# Patient Record
Sex: Female | Born: 1955 | Race: White | Hispanic: No | Smoking: Never smoker
Health system: Southern US, Community
[De-identification: ages and names within clinical notes are randomized; demographics above are authoritative.]

## PROBLEM LIST (undated history)

## (undated) DIAGNOSIS — E119 Type 2 diabetes mellitus without complications: Secondary | ICD-10-CM

## (undated) DIAGNOSIS — S8990XA Unspecified injury of unspecified lower leg, initial encounter: Secondary | ICD-10-CM

## (undated) DIAGNOSIS — I4891 Unspecified atrial fibrillation: Secondary | ICD-10-CM

## (undated) DIAGNOSIS — E785 Hyperlipidemia, unspecified: Secondary | ICD-10-CM

## (undated) DIAGNOSIS — F32A Depression, unspecified: Secondary | ICD-10-CM

## (undated) DIAGNOSIS — I10 Essential (primary) hypertension: Secondary | ICD-10-CM

## (undated) DIAGNOSIS — I639 Cerebral infarction, unspecified: Secondary | ICD-10-CM

## (undated) HISTORY — DX: Hyperlipidemia, unspecified: E78.5

## (undated) HISTORY — DX: Cerebral infarction, unspecified: I63.9

## (undated) HISTORY — PX: HYSTERECTOMY: SHX81

## (undated) HISTORY — PX: TONSILLECTOMY AND ADENOIDECTOMY: SUR1326

## (undated) HISTORY — DX: Depression, unspecified: F32.A

## (undated) HISTORY — PX: TONSILLECTOMY: SUR1361

## (undated) HISTORY — DX: Essential (primary) hypertension: I10

## (undated) HISTORY — DX: Unspecified atrial fibrillation: I48.91

## (undated) HISTORY — DX: Type 2 diabetes mellitus without complications: E11.9

---

## 2014-02-18 ENCOUNTER — Encounter: Payer: Self-pay | Admitting: Psychiatric/Mental Health

## 2014-02-18 ENCOUNTER — Ambulatory Visit (INDEPENDENT_AMBULATORY_CARE_PROVIDER_SITE_OTHER): Payer: Medicare Other | Admitting: Psychiatric/Mental Health

## 2014-02-18 DIAGNOSIS — F1920 Other psychoactive substance dependence, uncomplicated: Secondary | ICD-10-CM

## 2014-02-18 DIAGNOSIS — F1994 Other psychoactive substance use, unspecified with psychoactive substance-induced mood disorder: Secondary | ICD-10-CM

## 2014-02-18 MED ORDER — BUPROPION HCL ER (XL) 150 MG PO TB24
150.0000 mg | ORAL_TABLET | Freq: Every morning | ORAL | Status: DC
Start: 2014-02-18 — End: 2014-03-19

## 2014-02-19 NOTE — Progress Notes (Signed)
[facility] Initial Evaluation    Date/Time:   02/19/2014  1:48 PM  Name:  Lisa Hampton, Lisa Hampton  MRN:   [id number]  Age:   58 y.o.  DOB:   1955/09/06        Referred by     Self    Chief Complaint   Patient presents with   . Depression   . Anxiety   . Alcohol Problem       History of Present Illness     Source and Reliability: This is the first psychiatric medication evaluation for this 58 year-old female.  The history was obtained from the patient and available records and is considered reliable. Limits of confidentiality were discussed with the patient and he/she participated in the interview voluntarily.      CC- "I've been meaning to see a Psychiatrist for awhile now."    HPI: States a history of depression and anxiety that has been managed by her PCM for the last 3 years.  This was a result of feelings of "sadness" post menopause.  Has had several recent stressors to include her mom having alzheimers and is worried she will get it as well.  Has 2 daughters and one recently left her husband and child which bothers her.  Moved out of mothers home in [place] to live with daughter and help raise her 3 kids which has been difficult.  Has had some recent family deaths.  On disability for A-fib and "felt like my life was over.  What am I going to do now?"  States intermittent uncontrolled crying, anhedonia and anergia.  "I feel lonely." Currently on paxil 20mg  though it was recently increased to 30mg  and then to 40 but couldn't tolerate so went back down to 20mg .  Also on Ativan 0.5mg  for the last 3 years and is taking approximately 2 times a week.  Has also been on Zoloft previously.  LLGD thought process with no evidence of internal stimuli or delusions.  No sx of mania.  Appetite good and not malnourished.  Denies feelings of hopeless/helpless and denies HI/SI.      Psychiatric Review of Symptoms:  All other symptoms reviewed and are negative except for this outlined above.    Psychiatric History:   Denies any  in or outpatient treatment previously.  No thoughts or behaviors of self harm.  Managed on meds by her PCM as above.    Psychiatric Family History:  States 2 sisters, both with Bipolar Disorder.    Substance History:   Endorses having 5-6 drinks a day which she feels is a way of coping with her depression.  The patient endorsed blackouts and forgetting certain things when drinking such as endings of movies or conversations she had with people on specific topics.  States daughter and son in law have become more concerned which is one of the reasons she is seeking assistance.  No legal/occupational problems with alcohol.  CAGE - 3/4.  No tobacco use, and denies illicit substance use.       Psychosocial History:   Born in [place] and then moved around for a little bit as father was in the [employer].  [place] 10 moved [place] which she considers home and went to highschool.  Went to nursing school, got married and moved to [place].  Worked as a Engineer, civil (consulting), mostly in L&D and stopped in 2010 after getting a-fib and going on disability.  Divorced after 24 years.  Has 2 daughters both grown with their own  children and families.  Currently living with daughter as above.       Psychiatric Review of Systems     Subjective Mood sad   Sleep no onset or maintenance problems   Appetite/Weight Normal / No known change   Concentration Normal   Energy Level Decreased   Psychosis Hallucinations - None  Delusions - None   Suicidal Suicidal Thoughts? no   Suicidal Plan?  no   Safety Access to Guns? NO access  Safety: Denies SI, HI, commits to safety   Homicidal Ideations no   Self-Injury Ideations no       Psychiatric/Mental Status Examination     General Appearance Neatly groomed, appropriately dressed and adequately nourished   Level of consciousness A&O x 4, Sensorium Clear   Attention & Concentration  Normal   Mini-MSE  Not indicated this visit       Cooperative/Engaged? Cooperative & Engaged   Psychomotor   Unremarkable   Speech Normal  Rate, Rythym, & Volume   Mood Dysphoric   Affect Full-Range / Expressive   Eye Contact Appropriate       Memory Intact   Thought Process & Associations Logical, Linear, Goal Directed   Thought Content  Delusions: None  Suicidal or Homicidal Ideation: No  Hallucinations: None      Insight & Judgment fair       Other Findings:        Physical Examination     There were no vitals filed for this visit.    No LMP recorded. Patient has had a hysterectomy.      Neuromuscular  No Tics, Tremors, Gait Impairments   Integumentary No Lesions, Injuries, Rashes on Exposed Skin   Respiratory No SOB, full expansion/excursion, normal rate     Past Psychiatric History     Current MH Provider(s) Katie Faraone   Current/Previous Diagnoses See HPI   Previous Medications See HPI   Psych Hospitalizations No   Suicide Attempts? No    Self Injury? No   Violence to Others No   History of Trauma/Abuse No history   Head Injury No   Seizures No       Family Psychiatric History      Mental Illness  Yes: See HPI   Substance Abuse No   Suicides No       Social History:     Lives With Child(ren)   Marital/Children divorced / 2 Child(ren)   Social Supports Friend(s)   Employment Status Disabled/Public Assistance   Legal issues No     Substance Use History     Recreational Drugs Types: Denies any  Pattern: Has never used   Alcohol Heavy, 5-6 drinks per day   Sub Abuse Treatments? None      Medical History     Past Medical History   Diagnosis Date   . Atrial fibrillation    . Diabetes mellitus    . Hypertension    . Hyperlipidemia    . CVA (cerebral vascular accident)        Past Surgical History   Procedure Laterality Date   . Tonsillectomy     . Hysterectomy         No Known Allergies       Assessment     58 y.o. female with alcohol dependence and co-morbid depression.  Due to active drinking will dx as substance induced.  Already on Paxil so discussed using Wellbutrin to assist with anergia and anhedonia.  No history of  a seizure disorder or head  trauma.  Patient was instructed that meds would only be able to be continued in the future if she began to work on her recovery.  She was given the information to contact [facility] for a CATS assessment.    Diagnosis(es):  Axis I: Substance induced mood disorder/depression.  Substance dependence  Axis II: Deferred  Axis III: a-fib, diabetes, HTN, Hyperlipidemia  Axis IV: Occupational, family stressors  Axis V: 55-60        Treatment Plan     Treatment options and alternatives reviewed with patient, along with detailed discussion of medication(s) and side effects, and they concur with following plan:    1. Meds - Side effects discussed to include cost/benefit to which patient consented.  1. Wellbutrin Xl 150mg  daily  2. Continue Paxil 20mg  daily  2. Follow up - The patient to follow up with this provider in 1 month, first available, or sooner as necessary.  3. I strongly recommended not drinking alcohol, at all, while taking medications.  I have reviewed the inherent risks, to include risk of death, by combining alcohol or drugs with medications (especially benzodiazepines and sedating medications). The patient understands that repeated use of alcohol while taking medications knowingly adds significant risk of increased health and functional problems.  The patient should consult with his medical provider before starting any new supplements or medications. Never mix alcohol with medications.   4. Discard all old medications - Patient was instructed that s/he should discard all medications not currently prescribed or considered active.  5. Emergency room, or call 911.   6. Medications reconciled  7. Consults - Recommended therapy and patient given a list of providers in local area.  Recommended patient for CATS.  8. Liaison - None  9. Labs - None  10. Thank you.    Carola Frost, MSN, PMHNP-BC    _____________________________________________  Carola Frost, NP    [facility]

## 2014-03-17 ENCOUNTER — Ambulatory Visit (INDEPENDENT_AMBULATORY_CARE_PROVIDER_SITE_OTHER): Payer: Medicare Other | Admitting: Clinical

## 2014-03-17 DIAGNOSIS — F1994 Other psychoactive substance use, unspecified with psychoactive substance-induced mood disorder: Secondary | ICD-10-CM

## 2014-03-17 DIAGNOSIS — F419 Anxiety disorder, unspecified: Secondary | ICD-10-CM

## 2014-03-17 NOTE — Progress Notes (Signed)
Initial Clinical Interview    Name:  Lisa Hampton Nov 19, 1955 Medical Records: [id number]      Time in: 2:00  PM         Time out:  2:55  PM    Signs and Symptoms:   Client's/Caregiver's statement of problems and impairments (e.g., social, academic, affective, cognitive, memory, physical):    Depressive Disorder 311.00: usually depressed, loss of interest/pleasure, sleep +/- and fatigue.     Pt is a 58 yo female referred here by her primary care doctor for depression. Pt has a long history of alcohol use. As recently as one month ago, pt was drinking 5-6 drinks per day, but has since cut back what she says is 50%. She lives in [place] with one of her daughters, daughters husband, and their 3 children. She has another daughter who struggles with addiction and with whom pt is not speaking to presently. Pts mother is in an [facility], and pts goal is to move back to [facility] within 2 years. Pt is not interested in CATS at this time, but was given information about the program. We will continue to assess whether or not that would be a more appropriate level of care. Pt reports that her biggest issue is depression, but that she is working on this through medication and through doing things she enjoys such as walking, and spending time with her 3 grandchildren. Pt will be back in 2 weeks.     Past History:   Social:    Marital status:  divorced.    Children: 2.    Friendships: Has friends    Mental History:  Patient denied .    Previous inpatient stay?  no.    Previous outpatient visit?   no.    Physical Health:   Significant:  Pt is on disability due to a chronic illness.    Substance abuse: Significant:  Pt has been drinking regularly since college, 5-6 drinks per day until one month ago..    Legal History:  Patient denied     Current Outpatient Prescriptions on File Prior to Visit   Medication Sig Dispense Refill   . aspirin 81 MG chewable tablet Chew 81 mg by mouth daily.     Marland Kitchen buPROPion XL (WELLBUTRIN XL) 150 MG 24 hr tablet  Take 1 tablet (150 mg total) by mouth every morning. 30 tablet 0   . Cholecalciferol (VITAMIN D) 1000 UNIT tablet Take 1,000 Units by mouth daily.     Marland Kitchen diltiazem (CARDIZEM CD) 180 MG 24 hr capsule Take 180 mg by mouth daily.     Marland Kitchen lisinopril (PRINIVIL,ZESTRIL) 20 MG tablet Take 20 mg by mouth daily.     Marland Kitchen LORazepam (ATIVAN) 0.5 MG tablet Take 0.5 mg by mouth every 6 (six) hours as needed for Anxiety.     . metFORMIN (GLUCOPHAGE) 500 MG tablet Take 250 mg by mouth 2 (two) times daily with meals.     . Omega-3 Fatty Acids (OMEGA-3 FISH OIL) 500 MG Cap Take by mouth.     Marland Kitchen PARoxetine (PAXIL) 20 MG tablet Take 20 mg by mouth every morning.     . rosuvastatin (CRESTOR) 40 MG tablet Take 50 mg by mouth daily.     Marland Kitchen warfarin (COUMADIN) 3 MG tablet Take 3 mg by mouth daily.       No current facility-administered medications on file prior to visit.        Mental Status Exam:   1).  Clinical  Observations:    Appearance:   age appropriate and casually dressed.     Attitude toward examiner:   Appropriate.       2).  Stream of Consciousness:    Normal.      3).  Thought Content:    Normal.    4).  Affect/Mood:    Normal.      DSM Diagnoses:     Axis I:   Alcohol dependence  303.90 and Depressive disorder NOS  311.00  Axis II:  Defer  Axis III: See problem list in the medical record  Axis IV: Economic problems  Other psychosocial or environmental problems  Problems related to social environment  Axis V: 61-70: mild symptoms.        PLAN:       Cognitive-Behavioral Therapy strategies.   Interpersonal, solution focused and support therapy strategies.

## 2014-03-19 ENCOUNTER — Encounter: Payer: Self-pay | Admitting: Psychiatric/Mental Health

## 2014-03-19 ENCOUNTER — Ambulatory Visit (INDEPENDENT_AMBULATORY_CARE_PROVIDER_SITE_OTHER): Payer: Medicare Other | Admitting: Psychiatric/Mental Health

## 2014-03-19 DIAGNOSIS — F419 Anxiety disorder, unspecified: Secondary | ICD-10-CM

## 2014-03-19 DIAGNOSIS — F1994 Other psychoactive substance use, unspecified with psychoactive substance-induced mood disorder: Secondary | ICD-10-CM

## 2014-03-19 MED ORDER — BUPROPION HCL ER (XL) 150 MG PO TB24
150.0000 mg | ORAL_TABLET | Freq: Every morning | ORAL | Status: DC
Start: 2014-03-19 — End: 2014-06-18

## 2014-03-19 MED ORDER — PAROXETINE HCL 20 MG PO TABS
20.0000 mg | ORAL_TABLET | Freq: Every morning | ORAL | Status: DC
Start: 2014-03-19 — End: 2014-07-13

## 2014-03-25 NOTE — Progress Notes (Signed)
Name:  Lisa Hampton  July 25, 1955  Medical Record Number:  [id number]    Date:  03/25/2014      Interim History - 58 year old female with history of depression being seen for a follow up.  Currently on Wellbuttrin XL 150mg  daily and Paxil 20mg  daily.  Also getting PRN ativan from PCP that she takes sporadically though not everyday.  Called and spoke to [facility] about CATS but states she isn't able to do the 3 hours, 3 days every week but has cut back on alcohol intake.  "No alcohol except for wine.  No liquor or beer.  Several days I haven't had any."  Has also recently started seeing  A therapist.  States things are pretty good.  "I feel better on the Wellbutrin as it give me more energy and I don't feel as depressed."  Reduced paxil on her own to 20mg .  States her daughter has also noticed a signficant improvement in patients mood as well.  No signs of internal stimuli and denies any perceptual disturbances and presents with a LLGD thought process.  No sx of mania or substance disorder.  Denies any feelings of hopelessness/helplessness and denies HI/SI.  Sleeping well and appetite is good and does not appear malnourished.      Psychiatric Specialty Examination   (1-5 bullets- Problem Focused; at least 6 bullets Expanded Problem Focused; at least 9 bullets - Detailed; all bullets- Comprehensive Exam)   . [] Vital Signs see RN assessment that I have reviewed.   General Appearance and Manner:      [x] age appropriate    [] bearded    [x] casually dressed       [] deviant     [x] cooperative [] disheveled    [] older than stated age    [] overweight    [] piercings    [] tattooed    [] thin & gaunt looking    [] well dressed    [] younger than stated age     [x] good eye contact    [] avoidant eye contact    [] hesitant  .    Musculoskeletal: [x] normal    [] rigidity  [] flaccid       [] akathesia    [] choreaoathetoid movt [] tics    Gait: [x] normal gait    [] gait abnormality_______       . Speech:  [x] Normal pitch     [x] normal volume     [] articulation error    [] delayed    [] increased latency of response     [] loud    [] pressured    [] profane     [] soft [] perseveration   . Thought processes:  [x] Normal    []  goal directed   [] logical    []  illogical    [] flight of ideas     [] goal directed    [] concrete    [x]  associations intact [x]  abstract reasoning intact   . Description of associations []  loose   [] circumstantial     [] concrete    [] tangential    [x]  intact    . Description of abnormal or psychotic thoughts []  hallucination   []  delusions     safety:  [x]  absent of suicidal or homicidal ideation [] suicidal ideation      [] suicidal plan      [] suicidal intent      [] passive suicidal ideation      [] homicidal ideation      [] homicidal plan      [] homicidal intent []  actively trying to hurt self []  agitation []  preoccupation with violence   .  judgment  and insight [x]  intact    []  limited       []  fair    [] significantly lacking  []  description_______       . Orientation [x] fully oriented      [] disoriented to    [] time []  Person [] Place   . Memory : [x]  grossly intact    []  immediate recall deficit  []  recent memory deficit  [] delayed memory deficit   [] MMSE_______    [] MOCA________     Attention/Concentration: [x] normal    []  distractible      [] inattentive  .    Marland Kitchen Language: [x] age appropriate    []  naming okay   []  repetition    . Fund of knowledge: [x] age appropriate    []  adequate   [] in adequate []  above average   . Mood and affect: [x]  fine     [] angry    [] anxious    [] constricted    [] depressed     [] euphoric    [] euthymic    [] irritable    [] sad   Other Findings          Risk Assessment:  Suicidal ideation reported/observed:  no Plan:   Homicidal ideation reported/observed:no Plan:    Self-harm behavioral reported/observed: no   Plan:      Other risk behavior(s):    Interventions/Comments:      Studies ordered:    Current Medications:    Current Outpatient Prescriptions   Medication Sig Dispense Refill   . aspirin 81 MG chewable tablet  Chew 81 mg by mouth daily.     Marland Kitchen buPROPion XL (WELLBUTRIN XL) 150 MG 24 hr tablet Take 1 tablet (150 mg total) by mouth every morning. 30 tablet 2   . Cholecalciferol (VITAMIN D) 1000 UNIT tablet Take 1,000 Units by mouth daily.     Marland Kitchen diltiazem (CARDIZEM CD) 180 MG 24 hr capsule Take 180 mg by mouth daily.     Marland Kitchen lisinopril (PRINIVIL,ZESTRIL) 20 MG tablet Take 20 mg by mouth daily.     Marland Kitchen LORazepam (ATIVAN) 0.5 MG tablet Take 0.5 mg by mouth every 6 (six) hours as needed for Anxiety.     . metFORMIN (GLUCOPHAGE) 500 MG tablet Take 250 mg by mouth 2 (two) times daily with meals.     . Omega-3 Fatty Acids (OMEGA-3 FISH OIL) 500 MG Cap Take by mouth.     Marland Kitchen PARoxetine (PAXIL) 20 MG tablet Take 1 tablet (20 mg total) by mouth every morning. 30 tablet 2   . rosuvastatin (CRESTOR) 40 MG tablet Take 50 mg by mouth daily.     Marland Kitchen warfarin (COUMADIN) 3 MG tablet Take 3 mg by mouth daily.       No current facility-administered medications for this visit.         Side Effects:          [x]  Patient describes no side effects and none are in evidence.         []  Patient reported side effects of medication:none    REVIEW OF SYSTEMS  All other systems reviewed and are negative except     NO NEW MEDICAL ISSUES    Diagnosis:  Axis I:   Substance induced mood disorder (depression).  Polysub dep  Axis II:  Defer  Axis III:  See problem list in the medical record  Axis IV: Economic problems  Housing problems  Occupational problems  Other psychosocial or environmental problems  Axis V:  Current:  51-60: moderate  symptoms.               Highest in Past year:  51-60: moderate symptoms.      Treatment Goals:  Maintain function and prevent recurrence    Progress Compared to Expected: has slightly improved.    Treatment Plan: :Continue meds with no changes.  Patient encouraged to attempt to cut back no alcohol intake further.  Encouraged to remain in therapy.    Referral to: Continue therapy    Labs/Diagnostic tests ordered: No orders of the  defined types were placed in this encounter.       Return to office in 3 months.    Salam has been informed of her individual treatment plan.  This includes an explanation of the action of all prescribed psychoactive medications.    The benefits, potential side effects, risks and possible drug interactions were explained to Ursina who indicated that she understood and agrees to the plan of care.  All of her questions were answered.    Informed consent obtained from the patient for the use of Wellbutrin and Paxil after a discussion of the likely benefits, risks and alternatives. Medication side-effects both common and serious were reviewed.     Alfreda indicated that she understood and agrees to the plan of care.  All of her questions were answered to her satisfaction.      Carola Frost PMHNP-BC

## 2014-03-30 ENCOUNTER — Ambulatory Visit (INDEPENDENT_AMBULATORY_CARE_PROVIDER_SITE_OTHER): Payer: Medicare Other | Admitting: Clinical

## 2014-04-02 ENCOUNTER — Encounter (HOSPITAL_BASED_OUTPATIENT_CLINIC_OR_DEPARTMENT_OTHER): Payer: Self-pay

## 2014-04-02 NOTE — Progress Notes (Signed)
Date/Time: 04/02/2014, 1:51 PM  Interviewer: Keane Police  Patient Name: Lisa Hampton   DOB: 13-Jun-1955  SSN: [id number]  Gender: female  Patient Address:  [address] [place] [address]  Patient Phone Number(s):  [phone] (home)  , additional phone number: [phone]  Emergency Contact: , Phone number:   Relationship to Pt:    May we contact this person regarding your admission?     Caller: self  , Caller phone:above, Caller relationship:  Reason for Call: SA tx  Referral Source: Pt's Nurse Practitioner  Notes:    Insurance Info:   Company: [employer] number: [phone]  Policy Holder [facility]):   self   Relationship to PT: self    PH DOB:   Member ID: [id number]  Group #:      Insured Employer:   Insurance Address: [address]      Clinical Information  Presenting Problem: Etoh  Precipitating Event: NP recommended I call Cats to schedule a Cats assessment.  Pt was given an IOP appointment with no prescreen.  Writer followed up with the pt, completed prescreen and explained options regarding Cats tx, including detox which is the level of tx pt would most likely require given her sx profile.  Writer also explained the criteria set forth by Medicare for detox approval.  Pt reported she would consider her options and call back to schedule something if Cats was an option.  -[facility] 04/02/2014    Suicidal/Homicidal Risk?    Any hx of Suicidal thoughts or plans? No    Any hx of Homicidal Thoughts or Plans?  No    Imminent Risk of Suicide and/or Harm to Others?  No         Drug Use   Any current use of alcohol or drugs? alcohol    Do you use tobacco?: No    Drug of Choice # 1: etoh  Pattern of use: daily 6+ drinks for years  Last use? 04/01/2014    Drug of Choice # 2 :   Pattern of use:   Last use?     Drug of Choice # 3 :   Pattern of use:   Last use?:     Additional drug use info:     Current Withdrawal Symptoms:  Cravings, tremors    History of and last date of:    Hallucinations? No           Seizures?  No    Psych/Mental health Issues:   Depression, Anxiety      Medical issues:   Atrial Fibrulation, Diabetes, HTN, Hyperlipedemia,     Current Medication (medical or psychiatric):   Ativan, paxil, metformin, warfarin, crestor, glucophage, lisinopril    Additional Notes:     Does the patient require Hard of Hearing Services? No    Does the patient require language services? No    Preliminary Diagnosis: Alcohol Use Disorder, Severe    Recommended Level of Care: Detox/ Day Tx    Appointment with: Admissions Nurse      Location: [facility]             Date:              Time:     * Current IOP counselors that do assessments:   Garr, Laroy Apple, Ruben Reason    * DTX appointments 8am or 10am Mon-Fri ONLY (Do not include "step down" appointments in the scheduling process)

## 2014-04-06 ENCOUNTER — Encounter (HOSPITAL_BASED_OUTPATIENT_CLINIC_OR_DEPARTMENT_OTHER): Payer: Medicare Other | Admitting: Professional

## 2014-04-06 ENCOUNTER — Ambulatory Visit (INDEPENDENT_AMBULATORY_CARE_PROVIDER_SITE_OTHER): Payer: Medicare Other | Admitting: Clinical

## 2014-04-13 ENCOUNTER — Ambulatory Visit (INDEPENDENT_AMBULATORY_CARE_PROVIDER_SITE_OTHER): Payer: Medicare Other | Admitting: Clinical

## 2014-04-20 ENCOUNTER — Ambulatory Visit (INDEPENDENT_AMBULATORY_CARE_PROVIDER_SITE_OTHER): Payer: Medicare Other | Admitting: Clinical

## 2014-04-27 ENCOUNTER — Ambulatory Visit (INDEPENDENT_AMBULATORY_CARE_PROVIDER_SITE_OTHER): Payer: Medicare Other | Admitting: Clinical

## 2014-05-04 ENCOUNTER — Ambulatory Visit (INDEPENDENT_AMBULATORY_CARE_PROVIDER_SITE_OTHER): Payer: Medicare Other | Admitting: Clinical

## 2014-05-11 ENCOUNTER — Ambulatory Visit (INDEPENDENT_AMBULATORY_CARE_PROVIDER_SITE_OTHER): Payer: Medicare Other | Admitting: Clinical

## 2014-05-18 ENCOUNTER — Ambulatory Visit (INDEPENDENT_AMBULATORY_CARE_PROVIDER_SITE_OTHER): Payer: Medicare Other | Admitting: Clinical

## 2014-05-25 ENCOUNTER — Inpatient Hospital Stay
Admission: EM | Admit: 2014-05-25 | Discharge: 2014-05-27 | DRG: 605 | Disposition: A | Discharge status: Home / Self Care | Payer: Medicare Other | Attending: Trauma Surgery | Admitting: Trauma Surgery

## 2014-05-25 ENCOUNTER — Other Ambulatory Visit: Payer: Self-pay

## 2014-05-25 ENCOUNTER — Emergency Department: Payer: Medicare Other

## 2014-05-25 ENCOUNTER — Inpatient Hospital Stay: Payer: Medicare Other | Admitting: Trauma Surgery

## 2014-05-25 DIAGNOSIS — S8010XA Contusion of unspecified lower leg, initial encounter: Secondary | ICD-10-CM | POA: Diagnosis present

## 2014-05-25 DIAGNOSIS — S7010XA Contusion of unspecified thigh, initial encounter: Principal | ICD-10-CM | POA: Diagnosis present

## 2014-05-25 DIAGNOSIS — I739 Peripheral vascular disease, unspecified: Secondary | ICD-10-CM | POA: Diagnosis present

## 2014-05-25 DIAGNOSIS — E119 Type 2 diabetes mellitus without complications: Secondary | ICD-10-CM | POA: Diagnosis present

## 2014-05-25 DIAGNOSIS — E785 Hyperlipidemia, unspecified: Secondary | ICD-10-CM | POA: Diagnosis present

## 2014-05-25 DIAGNOSIS — S8012XA Contusion of left lower leg, initial encounter: Secondary | ICD-10-CM

## 2014-05-25 DIAGNOSIS — Z7901 Long term (current) use of anticoagulants: Secondary | ICD-10-CM

## 2014-05-25 DIAGNOSIS — F329 Major depressive disorder, single episode, unspecified: Secondary | ICD-10-CM | POA: Diagnosis present

## 2014-05-25 DIAGNOSIS — W109XXA Fall (on) (from) unspecified stairs and steps, initial encounter: Secondary | ICD-10-CM | POA: Diagnosis present

## 2014-05-25 DIAGNOSIS — I482 Chronic atrial fibrillation, unspecified: Secondary | ICD-10-CM | POA: Diagnosis present

## 2014-05-25 DIAGNOSIS — Z8673 Personal history of transient ischemic attack (TIA), and cerebral infarction without residual deficits: Secondary | ICD-10-CM

## 2014-05-25 DIAGNOSIS — G8911 Acute pain due to trauma: Secondary | ICD-10-CM | POA: Diagnosis present

## 2014-05-25 DIAGNOSIS — I10 Essential (primary) hypertension: Secondary | ICD-10-CM | POA: Diagnosis present

## 2014-05-25 HISTORY — DX: Unspecified atrial fibrillation: I48.91

## 2014-05-25 HISTORY — DX: Hyperlipidemia, unspecified: E78.5

## 2014-05-25 HISTORY — DX: Unspecified injury of unspecified lower leg, initial encounter: S89.90XA

## 2014-05-25 LAB — TYPE AND SCREEN
AB Screen Gel: NEGATIVE
ABO Rh: O POS

## 2014-05-25 LAB — PT/INR
PT INR: 2 — ABNORMAL HIGH (ref 0.9–1.1)
PT: 22.2 s — ABNORMAL HIGH (ref 12.6–15.0)

## 2014-05-25 LAB — CBC
Hematocrit: 36.2 % — ABNORMAL LOW (ref 37.0–47.0)
Hgb: 12 g/dL (ref 12.0–16.0)
MCH: 31.7 pg (ref 28.0–32.0)
MCHC: 33.1 g/dL (ref 32.0–36.0)
MCV: 95.8 fL (ref 80.0–100.0)
MPV: 10.5 fL (ref 9.4–12.3)
Nucleated RBC: 0 /100 WBC (ref 0–1)
Platelets: 187 10*3/uL (ref 140–400)
RBC: 3.78 10*6/uL — ABNORMAL LOW (ref 4.20–5.40)
RDW: 14 % (ref 12–15)
WBC: 7.13 10*3/uL (ref 3.50–10.80)

## 2014-05-25 LAB — PREPARE FRESH FROZEN PLASMA
Expiration Date: 201601080630
Expiration Date: 201601080630
Status: TRANSFUSED
Status: TRANSFUSED
UTYPE: O POS
UTYPE: O POS

## 2014-05-25 LAB — I-STAT CREATININE: Creatinine I-Stat: 0.7 mg/dL (ref 0.6–1.5)

## 2014-05-25 LAB — PT AND APTT
PT INR: 3 — ABNORMAL HIGH (ref 0.9–1.1)
PT: 30.5 s — ABNORMAL HIGH (ref 12.6–15.0)
PTT: 35 s (ref 23–37)

## 2014-05-25 LAB — GFR: EGFR: >60

## 2014-05-25 LAB — BASIC METABOLIC PANEL
BUN: 14 mg/dL (ref 7.0–19.0)
CO2: 19 mEq/L — ABNORMAL LOW (ref 22–29)
Calcium: 8.5 mg/dL (ref 8.5–10.5)
Chloride: 108 mEq/L (ref 100–111)
Creatinine: 0.7 mg/dL (ref 0.6–1.0)
Glucose: 123 mg/dL — ABNORMAL HIGH (ref 70–100)
Potassium: 4.4 mEq/L (ref 3.5–5.1)
Sodium: 137 mEq/L (ref 136–145)

## 2014-05-25 LAB — GLUCOSE WHOLE BLOOD - POCT: Whole Blood Glucose POCT: 149 mg/dL — ABNORMAL HIGH (ref 70–100)

## 2014-05-25 MED ORDER — PAROXETINE HCL 20 MG PO TABS
20.0000 mg | ORAL_TABLET | Freq: Every morning | ORAL | Status: DC
Start: 2014-05-25 — End: 2014-05-27
  Administered 2014-05-25 – 2014-05-26 (×2): 20 mg via ORAL
  Filled 2014-05-25 (×2): qty 1

## 2014-05-25 MED ORDER — OXYCODONE HCL 5 MG PO TABS
10.0000 mg | ORAL_TABLET | ORAL | Status: DC | PRN
Start: 2014-05-25 — End: 2014-05-26
  Administered 2014-05-25 – 2014-05-26 (×2): 10 mg via ORAL
  Filled 2014-05-25 (×2): qty 2

## 2014-05-25 MED ORDER — GLUCAGON 1 MG IJ SOLR (WRAP)
1.0000 mg | INTRAMUSCULAR | Status: DC | PRN
Start: 2014-05-25 — End: 2014-05-27

## 2014-05-25 MED ORDER — LACTATED RINGERS IV SOLN
INTRAVENOUS | Status: DC
Start: 2014-05-25 — End: 2014-05-25

## 2014-05-25 MED ORDER — ACETAMINOPHEN 325 MG PO TABS
650.0000 mg | ORAL_TABLET | Freq: Four times a day (QID) | ORAL | Status: DC | PRN
Start: 2014-05-25 — End: 2014-05-25

## 2014-05-25 MED ORDER — DEXTROSE 50 % IV SOLN
25.0000 mL | INTRAVENOUS | Status: DC | PRN
Start: 2014-05-25 — End: 2014-05-27

## 2014-05-25 MED ORDER — OXYCODONE HCL 5 MG PO TABS
5.0000 mg | ORAL_TABLET | ORAL | Status: DC | PRN
Start: 2014-05-25 — End: 2014-05-27
  Administered 2014-05-25 – 2014-05-27 (×7): 5 mg via ORAL
  Filled 2014-05-25 (×7): qty 1

## 2014-05-25 MED ORDER — LISINOPRIL 20 MG PO TABS
20.0000 mg | ORAL_TABLET | Freq: Every day | ORAL | Status: DC
Start: 2014-05-25 — End: 2014-05-27
  Administered 2014-05-25 – 2014-05-26 (×2): 20 mg via ORAL
  Filled 2014-05-25 (×2): qty 1

## 2014-05-25 MED ORDER — SODIUM CHLORIDE 0.9 % IV SOLN
INTRAVENOUS | Status: AC | PRN
Start: 2014-05-25 — End: 2014-05-25
  Administered 2014-05-25: 1000 mL/h via INTRAVENOUS

## 2014-05-25 MED ORDER — ACETAMINOPHEN 325 MG PO TABS
650.0000 mg | ORAL_TABLET | Freq: Four times a day (QID) | ORAL | Status: DC
Start: 2014-05-25 — End: 2014-05-27
  Administered 2014-05-25 – 2014-05-27 (×7): 650 mg via ORAL
  Filled 2014-05-25 (×7): qty 2

## 2014-05-25 MED ORDER — MORPHINE SULFATE 2 MG/ML IJ/IV SOLN (WRAP)
2.0000 mg | Status: DC | PRN
Start: 2014-05-25 — End: 2014-05-26
  Administered 2014-05-25 (×3): 2 mg via INTRAVENOUS
  Filled 2014-05-25 (×4): qty 1

## 2014-05-25 MED ORDER — IODIXANOL 320 MG/ML IV SOLN
100.0000 mL | Freq: Once | INTRAVENOUS | Status: AC | PRN
Start: 2014-05-25 — End: 2014-05-25
  Administered 2014-05-25: 100 mL via INTRAVENOUS

## 2014-05-25 MED ORDER — ONDANSETRON HCL 4 MG/2ML IJ SOLN
4.0000 mg | Freq: Three times a day (TID) | INTRAMUSCULAR | Status: DC | PRN
Start: 2014-05-25 — End: 2014-05-27
  Administered 2014-05-25 – 2014-05-27 (×2): 4 mg via INTRAVENOUS
  Filled 2014-05-25 (×2): qty 2

## 2014-05-25 MED ORDER — BUPROPION HCL ER (XL) 150 MG PO TB24
150.0000 mg | ORAL_TABLET | Freq: Every day | ORAL | Status: DC
Start: 2014-05-26 — End: 2014-05-27
  Administered 2014-05-26 – 2014-05-27 (×2): 150 mg via ORAL
  Filled 2014-05-25 (×2): qty 1

## 2014-05-25 MED ORDER — INSULIN REGULAR HUMAN 100 UNIT/ML IJ SOLN
1.0000 [IU] | INTRAMUSCULAR | Status: DC | PRN
Start: 2014-05-25 — End: 2014-05-27

## 2014-05-25 NOTE — ED Notes (Signed)
Istat Creat 0.07, result given to [facility].

## 2014-05-25 NOTE — H&P (Signed)
TRAUMA HISTORY AND PHYSICAL     Date Time: 05/25/2014 11:36 AM  Patient Name: [id number],89 A  Attending Physician: Particia Lather, MD  Primary Care Physician: No primary care provider on file.    Date of Admission:   05/25/2014 11:27 AM    Trauma Level:   Yellow    Assessment/Plan:     Patient Active Problem List   Diagnosis   . Leg hematoma   . Warfarin anticoagulation   . Acute pain due to trauma   . Peripheral vascular disease   . Chronic atrial fibrillation       Plan by systems:  Neuro: pain control with po meds  Pulm: encourage IS, OOB  CV: resume home lisinopril, amlodipine; hold coumadin  Endo: accuchecks and SSI  GI: diabetic diet  Heme/ID: no abx indicated, FFP to reverse INR and hold coumadin  Renal: monitor UOP  Neuromuscular: q2h neurovascular checks; watch for expanding hematoma  Psych: no acute issues  Wounds: no acute issues  Pt seen and examined by attdg, Dr Estil Daft, and plan d/w ED attdg, Dr Toney Rakes      Patient will be admitted to: [facility]    Consulting Services:   None    Patient Complaint:   435 A Whiskey is a 59 y.o. female who presents to the hospital after Fall: Fall from # of stairs: 4.  Fell last night around 9pm-- lost balance when carrying several bags down the stairs but was able to get up and walk to bed. No LOC. This morning when she awoke the LLE was noted to be painful and swollen. EMS reported no distal pulses. Currently she c/o LLE pain, denies paresthesia.  Motor intact. No pain anywhere else.    Scene Report:      Scene GCS: Eye opening 4 - spontaneous, Verbal Response 5 - alert/oriented, Motor Response 6 - obeys commands. Total GCS: 15   Transport: BLS, Time of Injury 9pm last night   Transferred from: From Scene   LOC: No   Intubated: No   Hemodynamically: Stable   C-spine immobilized pre-hospital: No        The medications, past medical/surgical history, family history, allergies & full review of systems were:  Reviewed    Allergies:   No Known Allergies    Medication:    Coumadin  Lisinopril  Amlodipine  Metformin    Past Medical History:     Past Medical History   Diagnosis Date   . A-fib    . CVA (cerebral vascular accident)    . Hypertension    . Hyperlipemia    . Diabetes mellitus    . Knee joint injury      torn ligaments       Past Surgical History:     Past Surgical History   Procedure Laterality Date   . Tonsillectomy and adenoidectomy     . Hysterectomy         Family History:   History reviewed. No pertinent family history.    Social History:     History     Social History   . Marital Status: N/A     Spouse Name: N/A     Number of Children: N/A   . Years of Education: N/A     Social History Main Topics   . Smoking status: Never Smoker    . Smokeless tobacco: Not on file   . Alcohol Use: No   . Drug Use: No   . Sexual Activity:  Not on file     Other Topics Concern   . Not on file     Social History Narrative   . No narrative on file   no tobacco, social EtOH, no IVDU    Vaccination:   Tetanus up to date: Unknown    Review of Systems:   Review of Systems   Constitutional: Negative for fever and chills.   HENT: Negative for nosebleeds.    Eyes: Negative for blurred vision and double vision.   Respiratory: Negative for cough and shortness of breath.    Cardiovascular: Negative for chest pain and palpitations.   Gastrointestinal: Negative for nausea, vomiting and abdominal pain.   Genitourinary: Negative for hematuria and flank pain.   Musculoskeletal: Positive for myalgias (LLE at knee), joint pain (L knee) and falls. Negative for back pain and neck pain.   Skin: Negative for rash.   Neurological: Negative for dizziness, tingling, sensory change, focal weakness, loss of consciousness, weakness and headaches.   Endo/Heme/Allergies: Bruises/bleeds easily (on coumadin).   Psychiatric/Behavioral: Negative.        Physical Exam:   Physical Exam   Constitutional: She is oriented to person, place, and time and well-developed, well-nourished, and in no distress. No distress.   HENT:    Head: Normocephalic and atraumatic.   Right Ear: External ear normal.   Left Ear: External ear normal.   Nose: No nasal deformity or nasal septal hematoma. No epistaxis.   Mouth/Throat: Oropharynx is clear and moist.   Eyes: Conjunctivae, EOM and lids are normal. Pupils are equal, round, and reactive to light.   Pupils equal and reactive 4mm b/l   Neck: No spinous process tenderness present.   No cervical collar in place   Cardiovascular: Normal rate and normal heart sounds.  An irregularly irregular rhythm present.   Pulses:       Radial pulses are 2+ on the right side, and 2+ on the left side.   Pulmonary/Chest: Effort normal and breath sounds normal. She exhibits no tenderness and no deformity.   Abdominal: Soft. She exhibits no distension. There is no tenderness.   Pelvis stable   Musculoskeletal: Normal range of motion.        Left knee: She exhibits swelling and ecchymosis. She exhibits normal range of motion and no deformity. Tenderness found.        Cervical back: She exhibits no bony tenderness and no deformity.        Thoracic back: She exhibits no bony tenderness and no deformity.        Lumbar back: She exhibits no bony tenderness and no deformity.   No gross deformities to all 4 extremities, motor and sensation intact to all 4 extremities  LLE with ecchymosis, edema at knee and just below knee medially  Palpable femoral pulses b/l, L DP and PT nonpalpable but with strong doppler signals, R DP palpable, R PT nonpalpable with with strong doppler signal, b/l feet cool, b/l lower legs with dry peeling skin   Neurological: She is alert and oriented to person, place, and time. GCS score is 15.   Skin: Skin is warm and dry. She is not diaphoretic.   Vitals reviewed.      Filed Vitals:    05/25/14 1204   BP: 163/102   Pulse: 116   Temp:    Resp: 18   SpO2: 100%         Labs:     Results     Procedure Component  Value Units Date/Time    Basic Metabolic Panel (BMP) [[id number]]  (Abnormal) Collected:  05/25/14  1147    Specimen Information:  Blood Updated:  05/25/14 1232     Glucose 123 (H) mg/dL      BUN 40.9 mg/dL      Creatinine 0.7 mg/dL      CALCIUM 8.5 mg/dL      Sodium 811 mEq/L      Potassium 4.4 mEq/L      Chloride 108 mEq/L      CO2 19 (L) mEq/L     GFR [[id number]] Collected:  05/25/14 1147     EGFR >60.0 Updated:  05/25/14 1232    PT/APTT [[id number]]  (Abnormal) Collected:  05/25/14 1147     PT 30.5 (H) sec Updated:  05/25/14 1228     PT INR 3.0 (H)      PT Anticoag. Given Within 48 hrs. warfarin (Couma      PTT 35 sec     CBC WITHOUT Differential [[id number]]  (Abnormal) Collected:  05/25/14 1147    Specimen Information:  Blood / Blood Updated:  05/25/14 1217     WBC 7.13 x10 3/uL      Hgb 12.0 g/dL      Hematocrit 41.3 (L) %      Platelets 187 x10 3/uL      RBC 3.78 (L) x10 6/uL      MCV 95.8 fL      MCH 31.7 pg      MCHC 33.1 g/dL      RDW 14 %      MPV 10.5 fL      Nucleated RBC 0 /100 WBC     i-Stat Creatinine [[id number]] Collected:  05/25/14 1208     i-STAT Creatinine 0.7 mg/dL Updated:  53/66/44 0347          Rads:   Radiological Procedure reviewed.      FOCUSED ABDOMINAL SONOGRAM  XR TIBIA FIBULA LEFT AP AND LATERAL  CT ANGIOGRAM LOWER EXTREMITY BILATERAL    Radiology Results (24 Hour)     Procedure Component Value Units Date/Time    CT Angiogram Lower Extremity Bilateral [[id number]] Collected:  05/25/14 1257    Order Status:  Completed Updated:  05/25/14 1355    Narrative:      CLINICAL HISTORY: Trauma to the left lower leg. Status post fall from  stairs. Left lower extremity pain and swelling.    TECHNIQUE: CTA of bilateral lower extremities was performed following  uneventful intravenous administration of 100 cc Visipaque 320 contrast.  MIP and 3-D reconstructions obtained.    FINDINGS: A large subcutaneous hematoma is seen anteriorly from the  distal left thigh into the prepatellar space/bursa. There are some fluid  hematocrit levels within this hematoma. A small arterial contrast blush  is seen along  the posterior superior margin of the hematoma suggesting  site of active bleed. This measures up to 3.3 cm in AP thickness and  extends approximately 18 cm craniocaudad length on sagittal  reconstructions.    Atherosclerotic calcification of the included distal abdominal aorta  with no aneurysm. Atherosclerotic calcifications and plaque of the iliac  arteries with no significant stenosis. The common, external and internal  iliac arteries are patent bilaterally.    Left lower extremity: Diffuse atherosclerotic disease of the lower  extremity arterial system. The common femoral, superficial femoral,  popliteal arteries are patent with no significant stenosis. Profunda  femoral artery is patent. The tibial peroneal trunk, anterior tibial,  posterior tibial and peroneal arteries are patent into the ankle and  foot.    Right lower extremity: Diffuse atherosclerotic disease of the lower  extremity arterial system. The common femoral, superficial femoral and  popliteal arteries are patent with no significant stenosis. Profunda  femoral artery is patent. The tibial peroneal trunk, anterior tibial,  posterior tibial and peroneal arteries are patent into the ankle and  foot.    No bowel dilatation in the included lower abdomen and pelvis. Normal  appendix. Colonic diverticulosis. No free fluid. No adenopathy. Bladder  appears unremarkable. Degenerative changes in the lower lumbar spine. No  joint effusion seen at the knees.      Impression:        1. Large subcutaneous hematoma anteriorly in the left distal thigh  extending into the prepatellar space/bursa. Small contrast blush along  the anterior superior margin of the hematoma compatible with site of  active bleed.  2. Diffuse atherosclerotic disease but no significant stenosis or  occlusion of the lower extremity arterial system. Three-vessel runoff  bilaterally.  3. Colonic diverticulosis.    COMMENT: This urgent result was discussed with and acknowledged by Dr.  Betsey Holiday,  at 1300 hours on 05/25/2014.    Kennyth Lose, MD   05/25/2014 1:18 PM      Tibia Fibula Left AP and Lateral [[id number]] Collected:  05/25/14 1214    Order Status:  Completed Updated:  05/25/14 1219    Narrative:      Indications: Trauma    AP and lateral views of the left lower leg were performed. This  demonstrated marked soft tissue swelling anterior to the patella. There  is no evidence of acute bony injury. The ankle mortise is anatomic.      Impression:       Soft tissue swelling anterior to the patella. No evidence of  acute bony injury.    Remer Macho, MD   05/25/2014 12:15 PM      Focused Abdominal Sonogram (FAST) [[id number]] Resulted:  05/25/14 1129    Order Status:  Completed Updated:  05/25/14 1129    Narrative:      The attached image(s) (individually and collectively, "Image") is/are from   emergency department visit encounter(s) occurring outside the radiology   department at [facility]. This examination is performed by clinical staff. The   image(s) is/are not reviewed by radiology personal and there is no   radiology interpretation provided. For a report for this procedure please   refer to the trauma physician notes in Epic.           Attending Attestation:   I have seen the patient, duplicated the exam and reviewed the flow sheet, labs and imaging studies.  I agree with the assessment and plan.    Large left thigh hematoma with area of active blush. No evidence of acute ischemia although she has chronic peripheral vascular disease with only the right DP palpable and the remaining pedal pulses only with doppler signals. No evidence of compartment syndrome, pain is not out of proportion to physical findings, she has intact sensations and motor function in the foot. INR is supertherapeutic, coumadin on hold will reverse with FFP. Monitor vascular exam closely in [facility].    Particia Lather, MD, [facility]  Acute Care Surgeon  (General/Trauma/Critical Care)  [phone]

## 2014-05-25 NOTE — Progress Notes (Addendum)
Trauma / Acute Care Surgery NP    Subjective:  S/p fall down 4 stairs on 05/24/14 @ 2100. Patient continues with pain/tenderness to the LLE, sensation and motor intact.       Objective:  Filed Vitals:    05/25/14 1800   BP: 158/94   Pulse: 101   Temp: 98.1 F (36.7 C)   Resp: 24   SpO2: 99%       Physical exam:  A & O x 3, pain poorly controlled with IV morphine  Hemodynamically stable, a fib   Room Air  LLE hematoma at knee medially, up medial thigh and lateral below the knee , PT/DP pulses present with doppler    Plan:  2 ffp given in ED   repeat INR 2.0 (3.0)  Mark hematoma with sharpie   Wrap LLE with ace  If hematoma expands, give additional ffp  Tylenol scheduled and prn oxy 5-10  Restart home PM medication: Paxil and lisinopril, hold coumadin

## 2014-05-25 NOTE — Plan of Care (Signed)
Pt admitted to [facility] after fall 1/3 around 9pm. Large hematoma on left leg- marked with sharpie by MD. Monitor for increased bleeding, loss of pulses/sensation. R6E4. Pain reported at 8/10- 10mg  PRN oxycodone given with adequate effect. Pulses palpable aside from LLE DP and PT which are found via doppler. Q2H pulse checks. Afebrile. A fib on monitor, sinus tach. BP 150's/90s. Up to bedside commode with x1 assist- voided 1x yellow, clear urine. PT 22.2 and INR 2.0- remain off coumadin. No labs ordered for AM.

## 2014-05-25 NOTE — ED Provider Notes (Signed)
Physician/Midlevel provider first contact with patient: 05/25/14 1150                                      [facility] H&P         CLINICAL SUMMARY          Diagnosis:    .     Final diagnoses:   Leg hematoma, left, initial encounter         MDM Notes:      Admission note:  Pt reevaluated prior to admission. Patient understands and consents to hospital admission. Admitting physician contacted and accepts to the admitting floor. All results reviewed with patient and admitting physician.         Disposition:         Admit              CLINICAL INFORMATION        PRIMARY SURVEY:      Airway:  airway patent  Breathing: breath sounds equal bilaterally  Circulation: Dopplerable DP and PT only in left foot, not palpable. Dopplerable PT on right, palpable DP.  Otherwise normal throughout.  Disability: GCS: 15      HPI:      Chief Complaint: Fall: Fall from # of stairs: 3-4     Lisa Hampton is a 59 y.o. female who  has a past medical history of A-fib; CVA (cerebral vascular accident); Hypertension; Hyperlipemia; Diabetes mellitus; and Knee joint injury., BIBA presents with LLE pain and swelling.  Pt fell down ~3-4 stairs at 9pm yesterday, landing on L knee and outstretched R hand.  No LOC, did not strike her head.  Some pain to knee at that time but was able to stand and ambulate to bed.  When she woke up this AM, she noted severe pain and swelling to LLE.  Unable to bear weight or move leg without pain.  EMS were unable to get palpable pulses on leg.  Given Fentanyl en route with significant improvement of pain.     Symptoms are severe in severity.  Trauma occurred: <12 hours PTA    History obtained from: patient, EMS      ROS:      Positive and negative ROS elements as per HPI.  All other systems reviewed and negative.      Physical Exam:      Pulse 94  BP (!) 152/92 mmHg  Resp 18  SpO2 96 %  Temp 97.5 F (36.4 C)    Constitutional: A&Ox3. Vital sings reviewed.   Head: normocephalic and  atraumatic. Bilateral TMs and external ears normal, no hemotympanum. Nose normal. No sinus tenderness, nasal deformity or nasal septal hematoma. Oropharynx clear and moist. No facial trauma, no facial bone tenderness.   Eyes: PERRL, 4mm.  Neck:  No tracheal deviation. No c-spine stepoffs or TTP. No crepitus.  Cardiovascular: Irregularly, irregular.   Pulmonary/Chest: Breath sounds normal. No respiratory distress. No chest wall tenderness, no laceration, no crepitus.   Abdominal: Soft, nondistended, nontender. No rebound, no guarding.   GU: Normal rectal tone, no blood in rectum. No blood at the meatus.   Extremities: Pelvis stable. No wounds, no contusions, no lacerations, no deformities. Very swollen L calf with ecchymosis medially. Large echhymosis and swelling to left knee and left upper upper leg.  Abrasion to L knee.Dopplerable DP and PT RLE but not palpable. Dopplerable PT  on right, palpable DP.   Thoracic and lumbar spine: No tenderness or stepoffs along T or L spine.   Neurological: GCS 15. Normal strength and sensation throughout.              PAST HISTORY        Primary Care Provider: No primary care provider on file.        PMH/PSH:    .     Past Medical History   Diagnosis Date   . A-fib    . CVA (cerebral vascular accident)    . Hypertension    . Hyperlipemia    . Diabetes mellitus    . Knee joint injury      torn ligaments       She has past surgical history that includes Tonsillectomy and adenoidectomy and Hysterectomy.      Social/Family History:      She reports that she has never smoked. She does not have any smokeless tobacco history on file. She reports that she does not drink alcohol or use illicit drugs.    History reviewed. No pertinent family history.      Listed Medications on Arrival:    .     Previous Medications    WARFARIN (COUMADIN) 1 MG TABLET    Take 1 mg by mouth.      Allergies: She has No Known Allergies.            VISIT INFORMATION        Clinical Course in the ED:      The  patient was brough by EMS directly to the trauma room and was evaluated with the trauma team per protocol.      Medications Given in the ED:    .     ED Medication Orders     Start     Status Ordering Provider    05/25/14 1150  0.9%  NaCl infusion   Code/trauma/sedation continuous med     Route: Intravenous     Last MAR action:  New Bag SEOUDI, HANI            Procedures:      Procedures      Interpretations:      Critical Care Time (not including procedures): 30-74 minutes.   Due to the high risk of critical illness or multi-organ failure at initial presentation and/or during ED course.    System(s) at risk for compromise:  metabolic  Critical Diagnosis:   1. Leg hematoma, left, initial encounter         The patient was Hypotensive:   No     The patient was Hypoxic:   No     This does not including time spent performing other reported procedures or services.   Critical care time involved full attention to the patient's condition and included:   Review of nursing notes and/or old charts - Yes  Documentation time - Yes  Care, transfer of care, and discharge plans - Yes  Obtaining necessary history from family, EMS, nursing home staff and/or treating physicians - Yes  Review of medications, allergies, and vital signs - Yes   Consultant collaboration on findings and treatment options - Yes  Ordering, interpreting, and reviewing diagnostic studies/tab tests - Yes                 RESULTS        Lab Results:      Results     Procedure Component  Value Units Date/Time    Basic Metabolic Panel (BMP) [[id number]]  (Abnormal) Collected:  05/25/14 1147    Specimen Information:  Blood Updated:  05/25/14 1232     Glucose 123 (H) mg/dL      BUN 40.9 mg/dL      Creatinine 0.7 mg/dL      CALCIUM 8.5 mg/dL      Sodium 811 mEq/L      Potassium 4.4 mEq/L      Chloride 108 mEq/L      CO2 19 (L) mEq/L     GFR [[id number]] Collected:  05/25/14 1147     EGFR >60.0 Updated:  05/25/14 1232    PT/APTT [[id number]]  (Abnormal) Collected:   05/25/14 1147     PT 30.5 (H) sec Updated:  05/25/14 1228     PT INR 3.0 (H)      PT Anticoag. Given Within 48 hrs. warfarin (Couma      PTT 35 sec     CBC WITHOUT Differential [[id number]]  (Abnormal) Collected:  05/25/14 1147    Specimen Information:  Blood / Blood Updated:  05/25/14 1217     WBC 7.13 x10 3/uL      Hgb 12.0 g/dL      Hematocrit 41.3 (L) %      Platelets 187 x10 3/uL      RBC 3.78 (L) x10 6/uL      MCV 95.8 fL      MCH 31.7 pg      MCHC 33.1 g/dL      RDW 14 %      MPV 10.5 fL      Nucleated RBC 0 /100 WBC     i-Stat Creatinine [[id number]] Collected:  05/25/14 1208     i-STAT Creatinine 0.7 mg/dL Updated:  53/66/44 0347              Radiology Results:      CT Angiogram Lower Extremity Bilateral   Preliminary Result      Tibia Fibula Left AP and Lateral   Final Result    Soft tissue swelling anterior to the patella. No evidence of   acute bony injury.      Remer Macho, MD    05/25/2014 12:15 PM         Focused Abdominal Sonogram (FAST)   Preliminary Result                  Scribe Attestation:      Attestations:  I was acting as a scribe for Orlinda Blalock, MD on [id number] Naaman Plummer, HELEN    I am the first provider for this patient and I personally performed the services documented. MILLER, HELEN is scribing for me on [id number] A. This note accurately reflects work and decisions made by me.  Orlinda Blalock, MD                                  Orlinda Blalock, MD  05/25/14 [phone]

## 2014-05-25 NOTE — Progress Notes (Addendum)
05/25/14 Pt is a 59 year old female Lisa Hampton, DOB 04/20/2056) who presents in the ED as a code yellow after slipping and falling down 3-4 steps.  Immediately after the fall, pt states she was ambulatory and later went to bed.  This morning, pt noted LLE pain with swelling and the inability to ambulate.  Medics arrived to find pt's left leg without distal pulses.  Pt has a h/o CVA, DM and Afib.  Pt's daughter [name], [phone]) is currently at work but was notified of her mother's hospitalization by pt's friend who was on scene.  Pt resides with her daughter at:  [address] [place], [place] [address].  As per pt, she is divorced and on disability for Afib.   She has other children but they are not local.  Pt does not believe that her daughter will be leaving work to come to the hospital.  Pt will keep daughter updated by phone.      [name], MSW, ACSW  ED Social Worker  [facility]  [address]  [place], [place] [address]  [phone] or [phone]

## 2014-05-25 NOTE — ED Notes (Signed)
Next PT/INR will be at 5:30pm

## 2014-05-25 NOTE — Progress Notes (Signed)
Patient examined; there is no increase to LLE hematoma.   Continue to monitor.    Griselda Miner. Charissa Knowles NP   x [phone]

## 2014-05-25 NOTE — Progress Notes (Signed)
On-call Note  05/25/2014  11:31 PM    Patient seen and examined.  LLE hematoma actually appears slightly receded from borders drawn on by team earlier in the day.  Leg is soft, though sites with ecchymosis are fairly tender to palpation.  Pain control is improved.    RN reported brief desaturation to 87% earlier - no associated tachycardia or tachypnea, patient asymptomatic.  She does report a history of possible COPD (per patient, diagnosed the last time she had bronchitis).  She was at a O2 saturation of 98-99% on 1L when I visited.    -- Continue compression dressing for now  -- Continue to monitor LLE hematoma - appears improved  -- Pain control with scheduled Tylenol, PRN oxycodone, PRN morphine for breakthrough  -- O2 decreased to 0.5 L/min - continue to wean as able.  Patient observed for a few minutes, O2 saturation 93-94%    Unknown Jim, MD  General Surgery Resident, PGY-2  Team Spectra: [phone]  Pager#: [phone]

## 2014-05-26 DIAGNOSIS — S8012XA Contusion of left lower leg, initial encounter: Secondary | ICD-10-CM | POA: Insufficient documentation

## 2014-05-26 LAB — CBC AND DIFFERENTIAL
Basophils Absolute Automated: 0.01 10*3/uL (ref 0.00–0.20)
Basophils Automated: 0 %
Eosinophils Absolute Automated: 0.12 10*3/uL (ref 0.00–0.70)
Eosinophils Automated: 2 %
Hematocrit: 29.2 % — ABNORMAL LOW (ref 37.0–47.0)
Hgb: 9.2 g/dL — ABNORMAL LOW (ref 12.0–16.0)
Immature Granulocytes Absolute: 0 10*3/uL
Immature Granulocytes: 0 %
Lymphocytes Absolute Automated: 2.02 10*3/uL (ref 0.50–4.40)
Lymphocytes Automated: 41 %
MCH: 30.9 pg (ref 28.0–32.0)
MCHC: 31.5 g/dL — ABNORMAL LOW (ref 32.0–36.0)
MCV: 98 fL (ref 80.0–100.0)
MPV: 10.2 fL (ref 9.4–12.3)
Monocytes Absolute Automated: 0.66 10*3/uL (ref 0.00–1.20)
Monocytes: 13 %
Neutrophils Absolute: 2.11 10*3/uL (ref 1.80–8.10)
Neutrophils: 43 %
Nucleated RBC: 0 /100 WBC (ref 0–1)
Platelets: 141 10*3/uL (ref 140–400)
RBC: 2.98 10*6/uL — ABNORMAL LOW (ref 4.20–5.40)
RDW: 14 % (ref 12–15)
WBC: 4.92 10*3/uL (ref 3.50–10.80)

## 2014-05-26 LAB — GLUCOSE WHOLE BLOOD - POCT
Whole Blood Glucose POCT: 151 mg/dL — ABNORMAL HIGH (ref 70–100)
Whole Blood Glucose POCT: 164 mg/dL — ABNORMAL HIGH (ref 70–100)
Whole Blood Glucose POCT: 129 mg/dL — ABNORMAL HIGH (ref 70–100)
Whole Blood Glucose POCT: 165 mg/dL — ABNORMAL HIGH (ref 70–100)

## 2014-05-26 LAB — PT/INR
PT INR: 1.8 — ABNORMAL HIGH (ref 0.9–1.1)
PT: 20.7 s — ABNORMAL HIGH (ref 12.6–15.0)

## 2014-05-26 MED ORDER — SENNOSIDES-DOCUSATE SODIUM 8.6-50 MG PO TABS
2.0000 | ORAL_TABLET | Freq: Two times a day (BID) | ORAL | Status: DC
Start: 2014-05-26 — End: 2014-05-27
  Administered 2014-05-26 – 2014-05-27 (×3): 2 via ORAL
  Filled 2014-05-26 (×3): qty 2

## 2014-05-26 MED ORDER — LISINOPRIL 20 MG PO TABS
20.0000 mg | ORAL_TABLET | Freq: Every day | ORAL | Status: DC
Start: 2014-05-26 — End: 2014-05-26

## 2014-05-26 MED ORDER — ROSUVASTATIN CALCIUM 20 MG PO TABS
40.0000 mg | ORAL_TABLET | Freq: Every evening | ORAL | Status: DC
Start: 2014-05-26 — End: 2014-05-27
  Administered 2014-05-26: 40 mg via ORAL
  Filled 2014-05-26 (×2): qty 2

## 2014-05-26 MED ORDER — DILTIAZEM HCL ER COATED BEADS 180 MG PO CP24
180.0000 mg | ORAL_CAPSULE | Freq: Every day | ORAL | Status: DC
Start: 2014-05-26 — End: 2014-05-27
  Administered 2014-05-26 – 2014-05-27 (×2): 180 mg via ORAL
  Filled 2014-05-26 (×2): qty 1

## 2014-05-26 NOTE — Progress Notes - Trauma (Signed)
TSN met with pt to offer psychosocial support after trauma. Mental health assessment completed, and TSN contact information provided.     Family: Pt states she stays with her daughter and was here helping her for a few years when this happened. She states she is not worried about having support through recovery.  Employment: Pt is on disability from when she had a stroke and does not work.   Mental Health hx and current status: Pt says she was recently put on Wellbutrin to manage anxiety. She says she had a "situation" with her other daughter that caused her to seek some mental health support, and she continues to see her psychiatrist. Pt states her symptoms are currently well managed and she is not having anxiety while in the hospital.   Acute Stress Disorder/Post-Traumatic Stress: Pt denies and does not present with current symptoms of acute stress or PTSD.   Coping/Adjustment: Pt appears to be coping appropriately and shows no signs of distress r/t trauma or recovery.   Referrals: none  Overall Assessment: Pt states she sometimes loses her footing on steps, and she was holding items when she miscalculated the number of stairs, causing her to fall. She presents as upbeat and optimistic about her recovery, and pt seems motivated to participate in therapy and progress through recovery. No questions or concerns at this time.   Predicted Future Needs: Pt should be asked about anxiety management to ensure she has appropriate support through trauma recovery.    Virgel Gess, MA, [place] [id number]

## 2014-05-26 NOTE — PT Eval Note (Signed)
[facility]   Physical Therapy Evaluation   Patient: Lisa Hampton    MRN#: [id number]   Unit: [facility] TOWER 3  Bed: F341/F341.01    Discharge Recommendations:   Discharge Recommendation: Home with PRN assist as needed  DME Recommendation: DME Recommended for Discharge: Front wheel walker (PT issued crutches for stairs)    Left with SCDs           : on R LE only  Left with bed alarm   : not in use RN aware  Left with chair alarm : " " "  Left with fall mats       : in place    Assessment:   Lisa Hampton is a 59 y.o. female admitted 05/25/2014.  Pt presents with slightly reduced mobility from baseline level of function and would benefit from PT services to optimize independence with mobility and safety prior to discharging to the next level of care as recommended above.            Impairments: Assessment: Decreased functional mobility;Gait impairment.     Therapy Diagnosis: mobility impairment    Rehabilitation Potential: Prognosis: Good    Treatment Activities: Evaluation of mobility, strength and ROM.  Educated the patient to role of physical therapy, plan of care, goals of therapy and Lisa Hampton recommendations and safety with mobility.    Education: FWW / crutch training    Plan:   Treatment/Interventions: Gait training;Equipment eval/education;Compensatory technique education     PT Frequency: follow-up visit only   Risks/Benefits/POC Discussed with Pt/Family: With patient        Precautions and Contraindications:   Weight Bearing Status:  (L LE WBAT)    Consult received for Lisa Hampton for PT Evaluation and Treatment.  Patient's medical condition is appropriate for Physical therapy intervention at this time.    Medical Diagnosis: Leg hematoma, left, initial encounter [S80.12XA]      History of Present Illness:   Lisa Hampton is a 59 y.o. female admitted on 05/25/2014 with Fall from # of stairs: 4.  Fell last night around 9pm-- lost balance when carrying several bags  down the stairs but was able to get up and walk to bed. No LOC. This morning when she awoke the LLE was noted to be painful and swollen. EMS reported no distal pulses. Currently she c/o LLE pain, denies paresthesia.  Motor intact. No pain anywhere else.      Past Medical/Surgical History:  .   Tonsillectomy and adenoidectomy         .   Hysterectomy   .   A-fib      .   CVA (cerebral vascular accident)      .   Hypertension      .   Hyperlipemia      .   Diabetes mellitus      .   Knee joint injury            torn ligaments       X-Rays/Tests/Labs:  IMPRESSION:       1. Large subcutaneous hematoma anteriorly in the left distal thigh  extending into the prepatellar space/bursa. Small contrast blush along  the anterior superior margin of the hematoma compatible with site of  active bleed.  2. Diffuse atherosclerotic disease but no significant stenosis or  occlusion of the lower extremity arterial system. Three-vessel runoff  bilaterally.  3. Colonic diverticulosis.  Social History:   Prior Level of Function:  Prior level of function: Independent with ADLs, Ambulates independently  Driving: independent  DME Currently at Home:  (none)    Home Living Arrangements:  Living Arrangements: Children  Type of Home: House  Home Layout:  (lives in basement, 1 flight stairs)  DME Currently at Home:  (none)  Home Living - Notes / Comments:  (will have assist on [place] as needed)    Subjective:   Patient is agreeable to participation in the therapy session. Nursing clears patient for therapy.     "I'd like to get up"    Patient Goal:  (home)    Pain Assessment  Pain Assessment: Numeric Scale (0-10)  Pain Score: 4-moderate pain  POSS Score: Awake and Alert  Pain Location:  (L thigh on weight bearing)  Pain Intervention(s): Medication (See eMAR);Repositioned  Multiple Pain Sites: No    Objective:   Observation of Patient/Vital Signs:  Patient is in bed.     Observation of Patient/Vital signs:  Inspection/Posture:  (TIMC monitors,  ace wrap L thigh, SCD R LE only)    Cognition  Arousal/Alertness: Appropriate responses to stimuli  Orientation Level: Oriented X4  Following Commands: Follows all commands and directions without difficulty  Safety Awareness: independent  Insights: Fully aware of deficits    Neuro Status  Hand Dominance: right handed  Safety Awareness: intact    Musculoskeletal Examination:  Gross ROM  Neck/Trunk/ extremities ROM: within functional limits    Gross Strength  Neck/Trunk/ extremities Strength: WFL  Except R hip flexors/ quads inhibited by pain : grossly 3-/5    Tone  Tone: within functional limits    Functional Mobility:  Supine to Sit: Modified Independent  Scooting to HOB: Independent  Sit to Supine: Stand by Assist (uses inverted CR as leg lifter)  Sit to Stand: Supervision (at [employer])  Stand to Sit: Retail banker Transfers: Supervision  Device Used for Functional Transfer: front-wheeled walker    Ambulation:  Ambulation: Supervision;with front-wheeled walker (Vs CGA with CRs)  Ambulation Distance (Feet): 120 Feet  Pattern: L decreased stance time;decreased cadence;decreased step length;Step through (with VCs)  Stair Management: Stand by Assist;one rail R;step to pattern;forward;with crutches  Number of Stairs: 4     Balance:  Sitting - Static: Good  Sitting - Dynamic: Good  Standing - Static: Good (with FWW fair with CRs)  Standing - Dynamic: Good (with FWW fair with CRs)    Participation and Activity Tolerance:  Participation Effort: good  Endurance: Tolerates 30+ min exercise without fatigue      Patient left with call bell within reach, all needs met and all questions answered, RN notified of session outcome and patient response.      Goals:   Goals  Goal Formulation: With patient  Time for Goal Acheivement: 1 visit  Pt Will Transfer Bed/Chair: with rolling walker;modified independent;to maximize functional mobility and independence  Pt Will Ambulate: > 200 feet;with rolling walker;modified  independent;to maximize functional mobility and independence  Pt Will Go Up / Down Stairs: 1 flight;modified independent;With rail;With crutches;to maximize functional mobility and independence         Time of treatment:   PT Received On: 05/26/14  Start Time: 1800  Stop Time: 1840  Time Calculation (min): 40 min      Harlan Stains, PT pager [phone] [facility] link [phone]

## 2014-05-26 NOTE — Plan of Care (Addendum)
0800: No acute events. LLE pulses stable and obtainable via doppler. Left thigh ace wrap removed by Misty Stanley NP this morning - site appears stable if not improved. INR this morning = 1.8.     1020: MD team has rounded on Lisa Hampton. Transfer orders written. CBC ordered STAT.     1800: PT has seen patient. No acute events. Possible discharge tomorrow?

## 2014-05-26 NOTE — Progress Notes (Signed)
TRAUMA TERTIARY SURVEY FORM AND INITIAL PROGRESS NOTE       Interval History:   Lisa Hampton is a 59 y.o. female who was admitted 05/25/2014 11:27 AM to [facility] by Particia Lather, MD after Fall: Fall from # of stairs: 4.    Substance usage:   social drinker  The patient denies current or previous tobacco use.  denied.    SBRT (Screening, Brief Intervention, and Referral to Treatment) performed: Yes  CATS (Community addiction team) requested: N/A    Allergies:   No Known Allergies    Medical history:     Past Medical History   Diagnosis Date   . A-fib    . CVA (cerebral vascular accident)    . Hypertension    . Hyperlipemia    . Diabetes mellitus    . Knee joint injury      torn ligaments       Medications:     Prior to Admission medications    Medication Sig Start Date End Date Taking? Authorizing Provider   Ascorbic Acid (VITAMIN C) 1000 MG tablet Take 1,000 mg by mouth daily.   Yes [provider]   aspirin 81 MG chewable tablet Chew 81 mg by mouth daily.   Yes [provider]   lisinopril (PRINIVIL,ZESTRIL) 20 MG tablet Take 20 mg by mouth daily.   Yes [provider]   metFORMIN (GLUCOPHAGE-XR) 500 MG 24 hr tablet Take 500 mg by mouth every morning with breakfast.   Yes [provider]   buPROPion XL (WELLBUTRIN XL) 150 MG 24 hr tablet Take 150 mg by mouth daily.   Yes [provider]   Cholecalciferol (VITAMIN D) 1000 UNIT tablet Take 1,000 Units by mouth daily.   Yes [provider]   diltiazem (DILACOR XR) 180 MG 24 hr capsule Take 180 mg by mouth daily.   Yes [provider]   PARoxetine (PAXIL) 20 MG tablet Take 20 mg by mouth every morning.   Yes [provider]   rosuvastatin (CRESTOR) 40 MG tablet Take 40 mg by mouth nightly.   Yes [provider]   warfarin (COUMADIN) 1 MG tablet Take 1 mg by mouth.   Yes [provider]       Current Facility-Administered Medications   Medication  Dose Route Frequency   . acetaminophen  650 mg Oral 4 times per day   . buPROPion XL  150 mg Oral Daily   . diltiazem  180 mg Oral Daily   . lisinopril  20 mg Oral Daily   . PARoxetine  20 mg Oral QAM   . rosuvastatin  40 mg Oral QHS   . senna-docusate  2 tablet Oral BID            dextrose, glucagon (rDNA), insulin regular, morphine, ondansetron, oxyCODONE       Verify appropriate medications are reconciled: Yes    Review of systems since admission:   Review of Systems   Constitutional: Negative.    HENT: Negative.    Eyes: Negative.    Respiratory: Negative.    Gastrointestinal: Negative.    Genitourinary: Negative.    Musculoskeletal:        Lt thigh pain   Skin: Negative.    Neurological: Negative.    Psychiatric/Behavioral: Negative.    All other systems reviewed and are negative.      Available radiology data:     Radiology Results (24 Hour)  Procedure Component Value Units Date/Time    CT Angiogram Lower Extremity Bilateral [[id number]] Collected:  05/25/14 1257    Order Status:  Completed Updated:  05/25/14 1355    Narrative:      CLINICAL HISTORY: Trauma to the left lower leg. Status post fall from  stairs. Left lower extremity pain and swelling.    TECHNIQUE: CTA of bilateral lower extremities was performed following  uneventful intravenous administration of 100 cc Visipaque 320 contrast.  MIP and 3-D reconstructions obtained.    FINDINGS: A large subcutaneous hematoma is seen anteriorly from the  distal left thigh into the prepatellar space/bursa. There are some fluid  hematocrit levels within this hematoma. A small arterial contrast blush  is seen along the posterior superior margin of the hematoma suggesting  site of active bleed. This measures up to 3.3 cm in AP thickness and  extends approximately 18 cm craniocaudad length on sagittal  reconstructions.    Atherosclerotic calcification of the included distal abdominal aorta  with no aneurysm. Atherosclerotic calcifications and plaque of the  iliac  arteries with no significant stenosis. The common, external and internal  iliac arteries are patent bilaterally.    Left lower extremity: Diffuse atherosclerotic disease of the lower  extremity arterial system. The common femoral, superficial femoral,  popliteal arteries are patent with no significant stenosis. Profunda  femoral artery is patent. The tibial peroneal trunk, anterior tibial,  posterior tibial and peroneal arteries are patent into the ankle and  foot.    Right lower extremity: Diffuse atherosclerotic disease of the lower  extremity arterial system. The common femoral, superficial femoral and  popliteal arteries are patent with no significant stenosis. Profunda  femoral artery is patent. The tibial peroneal trunk, anterior tibial,  posterior tibial and peroneal arteries are patent into the ankle and  foot.    No bowel dilatation in the included lower abdomen and pelvis. Normal  appendix. Colonic diverticulosis. No free fluid. No adenopathy. Bladder  appears unremarkable. Degenerative changes in the lower lumbar spine. No  joint effusion seen at the knees.      Impression:        1. Large subcutaneous hematoma anteriorly in the left distal thigh  extending into the prepatellar space/bursa. Small contrast blush along  the anterior superior margin of the hematoma compatible with site of  active bleed.  2. Diffuse atherosclerotic disease but no significant stenosis or  occlusion of the lower extremity arterial system. Three-vessel runoff  bilaterally.  3. Colonic diverticulosis.    COMMENT: This urgent result was discussed with and acknowledged by Dr.  Betsey Holiday, at 1300 hours on 05/25/2014.    Kennyth Lose, MD   05/25/2014 1:18 PM      Tibia Fibula Left AP and Lateral [[id number]] Collected:  05/25/14 1214    Order Status:  Completed Updated:  05/25/14 1219    Narrative:      Indications: Trauma    AP and lateral views of the left lower leg were performed. This  demonstrated marked soft tissue swelling  anterior to the patella. There  is no evidence of acute bony injury. The ankle mortise is anatomic.      Impression:       Soft tissue swelling anterior to the patella. No evidence of  acute bony injury.    Remer Macho, MD   05/25/2014 12:15 PM      Focused Abdominal Sonogram (FAST) [[id number]] Resulted:  05/25/14 1129    Order Status:  Completed Updated:  05/25/14 1129    Narrative:      The attached image(s) (individually and collectively, "Image") is/are from   emergency department visit encounter(s) occurring outside the radiology   department at [facility]. This examination is performed by clinical staff. The   image(s) is/are not reviewed by radiology personal and there is no   radiology interpretation provided. For a report for this procedure please   refer to the trauma physician notes in Epic.           Current laboratory data:     Results     Procedure Component Value Units Date/Time    Glucose Whole Blood - POCT [[id number]]  (Abnormal) Collected:  05/26/14 0645     POCT - Glucose Whole blood 151 (H) mg/dL Updated:  11/91/47 8295    Prepare fresh frozen plasma [[id number]] Collected:  05/25/14 1254    Specimen Information:  Blood Updated:  05/26/14 0042     Plasma Plasma      BLUNIT [id number]      Status transfused      PRODUCT CODE (NON READABLE) E2684V00      Expiration Date 244010272536      UTYPE O POS      Plasma Plasma      BLUNIT [id number]      Status transfused      PRODUCT CODE (NON READABLE) L8756E33      Expiration Date 295188416606      UTYPE O POS     Glucose Whole Blood - POCT [[id number]]  (Abnormal) Collected:  05/25/14 2303     POCT - Glucose Whole blood 149 (H) mg/dL Updated:  23/55/73 2202    Prothrombin time/INR [[id number]]  (Abnormal) Collected:  05/25/14 1734    Specimen Information:  Blood Updated:  05/25/14 1821     PT 22.2 (H) sec      PT INR 2.0 (H)      PT Anticoag. Given Within 48 hrs. warfarin (Couma     Narrative:      AFTER FFP    Type and Screen [[id number]] Collected:   05/25/14 1324    Specimen Information:  Blood Updated:  05/25/14 1441     ABO Rh O POS      AB Screen Gel NEG     Basic Metabolic Panel (BMP) [[id number]]  (Abnormal) Collected:  05/25/14 1147    Specimen Information:  Blood Updated:  05/25/14 1311     Glucose 123 (H) mg/dL      BUN 26.9 mg/dL      Creatinine 0.7 mg/dL      CALCIUM 8.5 mg/dL      Sodium 485 mEq/L      Potassium 4.4 mEq/L      Chloride 108 mEq/L      CO2 19 (L) mEq/L     GFR [[id number]] Collected:  05/25/14 1147     EGFR >60.0 Updated:  05/25/14 1311    PT/APTT [[id number]]  (Abnormal) Collected:  05/25/14 1147     PT 30.5 (H) sec Updated:  05/25/14 1228     PT INR 3.0 (H)      PT Anticoag. Given Within 48 hrs. warfarin (Couma      PTT 35 sec     CBC WITHOUT Differential [[id number]]  (Abnormal) Collected:  05/25/14 1147    Specimen Information:  Blood / Blood Updated:  05/25/14 1217     WBC 7.13 x10 3/uL      Hgb 12.0 g/dL  Hematocrit 36.2 (L) %      Platelets 187 x10 3/uL      RBC 3.78 (L) x10 6/uL      MCV 95.8 fL      MCH 31.7 pg      MCHC 33.1 g/dL      RDW 14 %      MPV 10.5 fL      Nucleated RBC 0 /100 WBC     i-Stat Creatinine [[id number]] Collected:  05/25/14 1208     i-STAT Creatinine 0.7 mg/dL Updated:  30/86/57 8469          Physical examination:   Physical Exam   Constitutional: She is oriented to person, place, and time and well-developed, well-nourished, and in no distress.   HENT:   Head: Normocephalic and atraumatic.   Mouth/Throat: Oropharynx is clear and moist.   Eyes: Conjunctivae and EOM are normal. Pupils are equal, round, and reactive to light.   Neck: Normal range of motion. Neck supple. No JVD present.   Cardiovascular: Normal rate, regular rhythm and normal heart sounds.  Exam reveals no gallop and no friction rub.    No murmur heard.  Doppler left DP/PT.   Pulmonary/Chest: Effort normal and breath sounds normal. No respiratory distress. She has no wheezes.   Abdominal: Soft. Bowel sounds are normal. She exhibits no  distension. There is no tenderness.   Musculoskeletal: Normal range of motion. She exhibits edema (Lt thigh) and tenderness (Lt thigh).   Neurological: She is alert and oriented to person, place, and time. No cranial nerve deficit. Coordination normal. GCS score is 15.   Skin: Skin is warm and dry.   Lt thigh hematoma decreased in size from marker outline yesterday.   Psychiatric: Mood, memory, affect and judgment normal.       Vital signs:  Temp:  [97.5 F (36.4 C)-98.8 F (37.1 C)] 97.5 F (36.4 C)  Heart Rate:  [82-116] 82  Resp Rate:  [12-24] 15  BP: (104-163)/(65-105) 111/68 mmHg    List of injuries by system:   Musculoskeletal: LLE hematoma    Problem list:     Patient Active Problem List   Diagnosis   . Leg hematoma   . Warfarin anticoagulation   . Acute pain due to trauma   . Peripheral vascular disease   . Chronic atrial fibrillation       Verify problem list is appropriately updated: Yes    Consulting services:   None    Confirm consulting services have been notified: N/A    Assessment and plan:   Neurological: GCS 15. Pain controlled with scheduled Tylenol, prn Oxycodone. Hx CVA on Coumadin and ASA, CHADS score 5 per [facility], on hold since admit.  Orthopedic: No new injuries identified on tertiary exam.  Pulmonary: 1 episode of desaturation to 87% overnight on RA, required 1L NC, weaned off this am. Pulmonary toilet, encourage IS.  Cardiac: Hx AF on Coumadin, rate controlled 80s, resume home Cardizem 180mg  daily but hold Coumadin. HTN, SBP 102-163, resume home Lisinopril. HLD, resume home Crestor.  Endocrine: DM II BG 149-151, continue to hold home Metformin and give SSI prn.   Gastrointestinal: Tolerating ADA diet. Bowel regimen.  Renal: Voiding AUOP.  Hematologic/ID: LLE hematoma decreasing in size, H/H 10.9/29.2 from 12/36.2. Will recheck in am. Continue to hold Coumadin now, but reconsider starting it tomorrow with Lovenox bridge. Per [facility], will need INR checked Thursday or Friday and they  will manage.  Candidate for Lovenox:  No    Neuromuscular: OOB with assistance, to chair for all meals. Follow up PT/OT recs. Continue Q4hr neurovascular checks.  Cervical spine clearance: N/A  Psychiatric: Hx depression on home Wellbutrin and Paxil.  Wounds: ACE wrap to Lt thigh.  Dispo: Pending PT/OT recs. Transfer to ward.      Signed by Shanon Brow, AGACNP-BC  [phone]      Trauma surgeon attestation:

## 2014-05-26 NOTE — Plan of Care (Addendum)
Problem: Inadequate Tissue Perfusion  Goal: Adequate tissue perfusion will be maintained  Outcome: Progressing  Neurovascular status checked q2h.    N: Neurovascular checks q2h. Sensation and motor intact. Oxicodone 5mg  q4h PRN with morphine 2mg  q2h PRN for break-thru pain as been adequately managing pain, allowing for mobility to improve.  CV: Afib on monitor rate 80s-90s at rest, up to 130s when OOB. Normotensive. Afebrile. DP and PT pulses doppler on LLE, LLE cool to touch with >3sec cap refill. Hematoma to lower thigh area with ace wrap compression bandage, borders outlined by sharpie. If hematoma expands, notify trauma team. Goal INR 2.0, but no serial coags needed per NP.   R: Periodic desats to 85% on RA; Patient reported recent diagnosis of COPD and recent bronchitis; placed on 1L nc to maintain sats 88-92% per oncall resident; weaned to RA.  GI: Consistent CHO diet; accuchecks ACHS. No BM. Last BM 1/4 in AM.   GU: Voids, auop.  MS: Mobility improved once pain was under control. Standby assist to [facility].  SK: Hematoma LLE, skin cool and dry, intact.     Patient removed compression wrap at 0200 due to pain; RN educated Patient on reason for compression wrap and to not remove it. Gave pain medication and ice for pain, rewrapped LLE.

## 2014-05-26 NOTE — Discharge Summary (Signed)
Discharge Summary    Date Time: 05/26/2014 7:56 PM  Patient Name: Lisa Hampton  Attending Physician: Particia Lather, MD  Service:  1: Trauma/Acute Care Surgery    Date of Admission:   05/25/2014    Date of Discharge:    05/27/2013     Reason for Admission:   Leg hematoma, left, initial encounter [S80.12XA]    Problems:   Lists the present on admission hospital problems  Present on Admission:   . Leg hematoma  . Acute pain due to trauma  . Peripheral vascular disease  . Chronic atrial fibrillation    Hospital Problems:  Active Problems:    Leg hematoma    Warfarin anticoagulation    Acute pain due to trauma    Peripheral vascular disease    Chronic atrial fibrillation    Leg hematoma, left, initial encounter      Discharge Dx:     1. Leg hematoma, left, initial encounter        Consultations:   None    Procedures performed:   None    HPI:   Lisa Hampton is 59 y.o. female that presents to the hospital s/p mechanical fall down 4 stairs on 05/24/14 @ 9pm, when she awoke her LLE was swollen and painful and she called rescue, EMS reported no distal pulses. Work up revealed  no vascular injury. Patient has a history of a fib with  CVA and takes coumadin,  INR 3.0 on admit.    Hospital Course:   In the emergency department  2 units FFP were given, with correction to 2.0.  She was admitted to the [facility] for q2 hour neurovascular checks, pain management, hematoma monitoring and coumadin was held.   05/26/13 am INR was 1.8 and no additional FFP was given.   Patient was evaluated by physical therapy and occupational therapy who recommended home, front wheel walker with prn assistance.    During hospital stay LLE remained neurovascularly, sensory and motor intact.    At the time of discharge the patient was afebrile and her vital signs were within normal limits. she was ambulatory and able to void spontaneously without any difficulty.  she was tolerating a diet and her pain was well-controlled with oral medication.  Discharge  Medications:     Current Discharge Medication List      START taking these medications    Details   acetaminophen (TYLENOL) 325 MG tablet Take 2 tablets (650 mg total) by mouth every 6 (six) hours as needed for Pain.      oxyCODONE (ROXICODONE) 5 MG immediate release tablet Take 1 tablet (5 mg total) by mouth every 4 (four) hours as needed for Pain.  Qty: 60 tablet, Refills: 0      senna-docusate (PERICOLACE) 8.6-50 MG per tablet Take 2 tablets by mouth 2 (two) times daily.         CONTINUE these medications which have NOT CHANGED    Details   Ascorbic Acid (VITAMIN C) 1000 MG tablet Take 1,000 mg by mouth daily.      buPROPion XL (WELLBUTRIN XL) 150 MG 24 hr tablet Take 150 mg by mouth daily.      Cholecalciferol (VITAMIN D) 1000 UNIT tablet Take 1,000 Units by mouth daily.      diltiazem (DILACOR XR) 180 MG 24 hr capsule Take 180 mg by mouth daily.      lisinopril (PRINIVIL,ZESTRIL) 20 MG tablet Take 20 mg by mouth daily.      metFORMIN (  GLUCOPHAGE-XR) 500 MG 24 hr tablet Take 500 mg by mouth every morning with breakfast.      PARoxetine (PAXIL) 20 MG tablet Take 20 mg by mouth every morning.      rosuvastatin (CRESTOR) 40 MG tablet Take 40 mg by mouth nightly.         STOP taking these medications       aspirin 81 MG chewable tablet Comments:   Reason for Stopping:         warfarin (COUMADIN) 1 MG tablet Comments:   Reason for Stopping:               Disposition:   Home     Discharge Instructions:      Call your cardiologist, Dr. Trish Hampton, on Monday morning for instructions regarding your anticoagulation and INR monitoring.         TRAUMA SERVICE DISCHARGE INSTRUCTIONS    [address], [address]  [place], [place]  [address]  Phone [phone], [place] [phone]      Discharge Instructions and Follow Up Information:  -Your primary physician team during your stay was the Acute Care Surgery service (ACS) (also known as Trauma), whose surgeons specialize in General Surgery, Trauma Surgery, and Critical Care. Our  surgeons are Board Certified in both General Surgery and Surgical Critical Care.    You have been provided with a list of doctors (see above) who treated you during your hospitalization.  Not all patients need to follow up with the surgeon.  If instructed to do so, please call [phone] within 48 hours of your discharge To make a Clinic appointment for the following date:  06/08/2014     The address is [address].     If you have forms that need a doctor's signature:  - If you have insurance forms, FMLA forms, or worker's compensation forms that need to be filled out by Designer, industrial/product, please mail or fax the forms to our office at the following address:  Trauma Services  [address], [address]  [place], [place] [address]  Fax: [phone]    Pain Medication  The ACS service can manage your general postoperative or post-trauma pain. If your pain is from injuries or surgery that was handled by another surgery team or consultant, such as Orthopedics, Neurosurgery, or Spine surgery, we kindly request that you call that surgeon's office to address your pain. If you have follow-up with the Trauma Service, please call the Trauma Service Office for medication refills at [phone].   Please plan accordingly as refills can take up to 24-48 hours.    Taking Pain Medications  It is important to take your medications on time and never take more than the prescribed amount.  Use your pain medication only as directed. Take only the medication that your health care provider tells you to take.  Take your pain medications with some food to avoid an upset stomach.  Remember medications take time to work.  Most pain relievers taken by mouth take at least 20-30 minutes to take effect.  So take your medication at regular times as directed.  Don't wait until the pain gets bad to take it.  Try to time your medication so that you take it before the beginning of an activity, such as dressing or sitting at the  table for dinner.  Taking your medication at night may help you get a good night's rest.  If the pain lessens, try taking your  pain medication less often. Start by trying to eliminate a dose of pain medication at a time of day when you experience less pain.  If you are not able to completely eliminate the dose, try replacing the dose with a dose of acetaminophen (Tylenol), unless you have been directed not to use acetaminophen use by a physician. In this way, you can slowly wean your use of pain medication as your pain decreases, continuing to eliminate or replace other doses as described above, until you no longer need to take any medicine for pain.   Constipation is a common side effect of pain medications.  Eating lots of fruits and vegetables and drinking lots of water helps relieve constipation.  You should be taking a stool softener (ex. Colace) as long as you are on any narcotic pain medication.  You should be having a bowel movement at least every 2-3 days.  If not, then proceed with over the counter laxatives (Senokot -S/Miralax), enemas, or suppositories to fix the constipation. You can obtain these at your local pharmacy if you develop constipation.     Avoid drinking alcohol while taking pain medicine.    Avoid driving or operating machinery while taking pain medication.   Please keep track of how many pills you have left so you will not completely run out before you need a refill.     Call your doctor if you notice any of these symptoms:    . Nausea, vomiting, diarrhea, lasting constipation, or stomach cramps  . Breathing problems or a fast heart rate  . Feeling very tired, sluggish, or dizzy  . Skin rash  . Pain that is not being treated by the prescribed amount of medication            Home Health Discharge Information      [employer]:  Name: [employer] Phone #: [phone]   Equipment Ordered: Rolling walker delivered to patients room.   Activity: activity as  tolerated, avoid activities which cause pain. Walking and climbing up stairs are ok, but avoid heavy lifting for the next 3-4 weeks. No driving while taking narcotic pain medication.  Diet: regular diet .The patient is instructed to drink plenty of fluids to maintain adequate hydration.        Follow Up:   [facility]  [address]  [place] [address]  [phone]  Schedule an appointment as soon as possible for a visit on 06/08/2014  for follow up with Trauma Services    Shelbie Ammons, MD  [phone] [facility]  [address]  [place] [address]  [phone]    Call on 06/01/2014  Call first thing Monday morning 06/01/14 for instructions regarding coumadin and INR monitoring    Lana Fish, MD  [address]  [place] [address]  [phone]              Signed by: Georgann Housekeeper    At the time of discharge the patient was given instructions detailing her discharge care and time was taken to answer questions.

## 2014-05-27 LAB — CBC
Hematocrit: 27.1 % — ABNORMAL LOW (ref 37.0–47.0)
Hgb: 8.8 g/dL — ABNORMAL LOW (ref 12.0–16.0)
MCH: 31.7 pg (ref 28.0–32.0)
MCHC: 32.5 g/dL (ref 32.0–36.0)
MCV: 97.5 fL (ref 80.0–100.0)
MPV: 10.8 fL (ref 9.4–12.3)
Nucleated RBC: 0 /100 WBC (ref 0–1)
Platelets: 171 10*3/uL (ref 140–400)
RBC: 2.78 10*6/uL — ABNORMAL LOW (ref 4.20–5.40)
RDW: 14 % (ref 12–15)
WBC: 6.43 10*3/uL (ref 3.50–10.80)

## 2014-05-27 LAB — GLUCOSE WHOLE BLOOD - POCT: Whole Blood Glucose POCT: 154 mg/dL — ABNORMAL HIGH (ref 70–100)

## 2014-05-27 MED ORDER — OXYCODONE HCL 5 MG PO TABS
5.0000 mg | ORAL_TABLET | ORAL | Status: DC | PRN
Start: 2014-05-27 — End: 2014-07-13

## 2014-05-27 MED ORDER — SENNOSIDES-DOCUSATE SODIUM 8.6-50 MG PO TABS
2.0000 | ORAL_TABLET | Freq: Two times a day (BID) | ORAL | Status: DC
Start: 2014-05-27 — End: 2014-07-13

## 2014-05-27 MED ORDER — ACETAMINOPHEN 325 MG PO TABS
650.0000 mg | ORAL_TABLET | Freq: Four times a day (QID) | ORAL | Status: AC | PRN
Start: 2014-05-27 — End: ?

## 2014-05-27 NOTE — Progress Notes (Signed)
Home Health Referral          Referral from Danielle Birx- Raybuck  (Case Manager) for home health care upon discharge.    Home Health Discharge Information         [employer]:  Name: [employer]    Phone #: [phone]     Equipment Ordered:  Rolling walker delivered to patients room.       The above services were set up by:      Manfred Arch DME Technician                                                  [facility] Liaison)   Phone  [phone]                                             Signed by: Manfred Arch  Date Time: 05/27/2014 12:47 PM

## 2014-05-27 NOTE — OT Eval Note (Signed)
[facility]   Occupational Therapy Evaluation/Discharge    Patient: SARENITY RAMAKER    MRN#: [id number]   Unit: [facility] TOWER 3  Bed: F341/F341.01                                     Discharge Recommendations:   Discharge Recommendation: Home with supervision   DME Recommended for Discharge:  (has built in shower chair. )          Assessment:   RAYMOND AZURE is a 59 y.o. female admitted 05/25/2014 with .  Pt presents at/near functional baseline, supervision  with basic ADLs.  No acute OT needs identified. D/C acute OT services.    Therapy Diagnosis: d/c OT    Rehabilitation Potential: NA    Treatment Activities: Evaluation   Educated the patient to role of occupational therapy, plan of care, goals of therapy and safety with mobility and ADLs, discharge instructions, home safety.    Plan:   D/C acute OT services     Treatment/Interventions: NA    Risks/benefits/POC discussed yes       Precautions and Contraindications:   Act as tolerated,WBAT LLE    Consult received for [name] for OT Evaluation and Treatment.  Patient's medical condition is appropriate for Occupational Therapy intervention at this time.      History of Present Illness:    BRESLYN ABDO is a 59 y.o. female admitted on 05/25/2014 with   Patient Complaint:    [address] is a 59 y.o. female who presents to the hospital after Fall: Fall from # of stairs: 4. Fell last night around 9pm-- lost balance when carrying several bags down the stairs but was able to get up and walk to bed. No LOC. This morning when she awoke the LLE was noted to be painful and swollen. EMS reported no distal pulses. Currently she c/o LLE pain, denies paresthesia. Motor intact. No pain anywhere else.      Admitting Diagnosis: Leg hematoma, left, initial encounter [S80.12XA]    Past Medical/Surgical History:    Diagnosis  Date    .  A-fib     .  CVA (cerebral vascular accident)     .  Hypertension     .  Hyperlipemia     .   Diabetes mellitus     .  Knee joint injury       torn ligaments      Tonsillectomy and adenoidectomy      .  Hysterectomy              Imaging/Tests/Labs:  XR Tibia fibula   IMPRESSION:   Soft tissue swelling anterior to the patella. No evidence of  acute bony injury.  CTA LE 1. Large subcutaneous hematoma anteriorly in the left distal thigh  extending into the prepatellar space/bursa. Small contrast blush along  the anterior superior margin of the hematoma compatible with site of  active bleed.  2. Diffuse atherosclerotic disease but no significant stenosis or  occlusion of the lower extremity arterial system. Three-vessel runoff  bilaterally.  3. Colonic diverticulosis.  Social History:   Prior Level of Function:  independent  Assistive Devices: none  Baseline Activity: community ambulation  DME Currently at Home: none  Home Living Arrangements: lives with family  Type of Home: house  Home Layout: steps    Subjective:my knee hurts  Patient is agreeable to participation in the therapy session. Nursing clears patient for therapy.     Patient Goal: go home  Pain:   Scale: 4/10  Location: left knee  Intervention: RN medicated    Objective:   Patient is in bed with telemetry, SCD's and peripheral IV access, ace wrap left LE,ecchymosis left LE in place.    Cognitive Status and Neuro Exam:  Alert and oriented followed all directions    Musculoskeletal Examination  Gross ROM: WFL both uE  Gross Strength: WFL,Both Ues      Sensory/Oculomotor Examination  Auditory: WFL  Tactile: WFL  Vision: reports left lower quadrant field impairment (old)      Activities of Daily Living  Eating: Independent  Grooming: Independent  Bathing: supervision  UE Dressing: Independent  LE Dressing: mod I  Toileting: Independent    Functional Mobility:  Supine to Sit: Independent  Sit to Stand: Independent  Transfers: mod I     Balance  Static Sitting: good  Dynamic Sitting: good  Static Standing: CGa  Dynamic Standing: mina    Participation  and Activity Tolerance  Participation Effort: good  Endurance: good    Patient left with call bell within reach, all needs met, SCDs on right LE, fall mat in place, bed alarm NA, chair alarm ON and all questions answered. RN notified of session outcome and patient response.     Goals:                                  Blinda Leatherwood, LSVT-BIG  [phone]       Time of treatment:   OT Received On: 05/27/14  Start Time: 0845  Stop Time: 0915  Time Calculation (min): 30 min

## 2014-05-27 NOTE — Plan of Care (Signed)
Problem: Pain  Goal: Patient's pain/discomfort is manageable  Outcome: Progressing  N: Neurovascular checks q4h LLE. Full sensation and motor intact. Pain managed with current regimen.   CV: Afib on monitor rate 80s-100s. Normotensive. Afebrile. LLE DP and PT pulses found via doppler. Toes cold and feet warm bilaterally. Hematoma smaller, compression bandage to thigh.  R: Sats 85-95% on RA.  GI: Consistent CHO diet, ACHS accuchecks. Refusing PRN SSI unless glucose >200.   GU: Up to BR, voids.   MS: Ambulating with single crutch, standby/contact guard.    Plan: TACS border. Possible d/c home?

## 2014-05-27 NOTE — Progress Notes (Signed)
Needs walker- ftf in and I called Barbara Cower for walker delivery to room     Tamera Punt, MSW  Social Worker Trauma    [phone]   [email or web address]

## 2014-05-27 NOTE — Plan of Care (Addendum)
Alert and oriented. Up in chair with standby assist. Pt cleared with PT/OT--view their notes for more info. AVS doc reviewed with a copy given to pt and daughter at bedside. Pt escorted with transporter approx [phone].

## 2014-05-27 NOTE — Progress Notes (Addendum)
Spoke with [facility] Dr. Bruce Donath. Per Dr. Levy Hampton it is reasonable to wait until Monday to restart anticoagulation. He also stated she does not need to make an appointment for Monday but does need to call the office Monday morning for instructions on coumadin dosing and INR monitoring. I discussed this with Lisa Hampton, stating understanding. This will also be in her discharge instructions.

## 2014-05-27 NOTE — Progress Notes (Signed)
ICU ACUTE CARE SURGERY / TRAUMA PROGRESS NOTE     Date/Time: 05/27/2014 6:49 AM  Patient Name: Lisa Hampton  Primary Care Physician: Lana Fish, MD  Hospital Day: 2     Post-op Day:      Assessment/Plan:   This patient is critically ill and requiring [facility] level of care due to does not require [facility] care.    The patient has the following active problems:  Patient Active Problem List   Diagnosis   . Leg hematoma   . Warfarin anticoagulation   . Acute pain due to trauma   . Peripheral vascular disease   . Chronic atrial fibrillation   . Leg hematoma, left, initial encounter       Plan by systems:  Neuro: GCS 15. Pain controlled with scheduled Tylenol, prn Oxycodone. HX CVA on Coumadin and ASA, CHADS score 5 per [facility], on hold since admit.  Seizure Prophylaxis not indicated  Pulm: Sats > 91% on RA. Pulmonary toilet, encourage IS.  CV: Hx AF on Coumadin, rate controlled 80s, continue home Cardizem 180mg  daily. HTN SBP stable on home Lisinopril. HLD on home Crestor.  Endo: DM BG 129-165 on SSI , Metformin on hold 2/2 IV contrast on admit, resume at discharge.  GI: Tolerating ADA diet. Bowel regimen.  GI Prophylaxis:  No prophylaxis need, patient is on a diet   Heme/ID: LLE hematoma stable, H/H stable, continue to hold Coumadin, plan to [place] today and pt to follow up with Dr. Eliseo Gum office later in week to resume Coumadin with Lovenox bridge.  DVT Prophylaxis: contraindication due to active bleeding or high risk of bleeding   Renal: Voiding AUOP.  Foley: no  Neuromuscular: OOB with assistance, to chair for all meals. PT/OT rec home with supervision and FWW, to be delivered to room today.  Weight Bearing Right Left   Upper Extremity WBAT WBAT   Lower Extremity WBAT WBAT   PT/OT: yes  Psych: Hx depression on home Wellbutrin and Paxil.  Wounds: ACE wrap to Lt thigh.  Dispo: Home today.    None    Interval History:   ANGLE Hampton is a 59 y.o. female who presents to the hospital after Fall: Fall from # of stairs:  4.     Significant overnight events include none    Allergies:   No Known Allergies    Medications:     Current Facility-Administered Medications   Medication Dose Route Frequency   . acetaminophen  650 mg Oral 4 times per day   . buPROPion XL  150 mg Oral Daily   . diltiazem  180 mg Oral Daily   . lisinopril  20 mg Oral Daily   . PARoxetine  20 mg Oral QAM   . rosuvastatin  40 mg Oral QHS   . senna-docusate  2 tablet Oral BID            dextrose, glucagon (rDNA), insulin regular, ondansetron, oxyCODONE      Labs:     Results     Procedure Component Value Units Date/Time    CBC without differential [[id number]]  (Abnormal) Collected:  05/27/14 0416    Specimen Information:  Blood / Blood Updated:  05/27/14 0457     WBC 6.43 x10 3/uL      Hgb 8.8 (L) g/dL      Hematocrit 40.9 (L) %      Platelets 171 x10 3/uL      RBC 2.78 (L) x10 6/uL  MCV 97.5 fL      MCH 31.7 pg      MCHC 32.5 g/dL      RDW 14 %      MPV 10.8 fL      Nucleated RBC 0 /100 WBC     Glucose Whole Blood - POCT [[id number]]  (Abnormal) Collected:  05/26/14 2115     POCT - Glucose Whole blood 165 (H) mg/dL Updated:  40/98/11 9147    Glucose Whole Blood - POCT [[id number]]  (Abnormal) Collected:  05/26/14 1729     POCT - Glucose Whole blood 129 (H) mg/dL Updated:  86/57/84 6962    Glucose Whole Blood - POCT [[id number]]  (Abnormal) Collected:  05/26/14 1204     POCT - Glucose Whole blood 164 (H) mg/dL Updated:  40/10/27 2536    CBC and differential [[id number]]  (Abnormal) Collected:  05/26/14 1048    Specimen Information:  Blood / Blood Updated:  05/26/14 1111     WBC 4.92 x10 3/uL      Hgb 9.2 (L) g/dL      Hematocrit 59.5 (L) %      Platelets 141 x10 3/uL      RBC 2.98 (L) x10 6/uL      MCV 98.0 fL      MCH 30.9 pg      MCHC 31.5 (L) g/dL      RDW 14 %      MPV 10.2 fL      Neutrophils 43 %      Lymphocytes Automated 41 %      Monocytes 13 %      Eosinophils Automated 2 %      Basophils Automated 0 %      Immature Granulocyte 0 %      Nucleated RBC 0  /100 WBC      Neutrophils Absolute 2.11 x10 3/uL      Abs Lymph Automated 2.02 x10 3/uL      Abs Mono Automated 0.66 x10 3/uL      Abs Eos Automated 0.12 x10 3/uL      Absolute Baso Automated 0.01 x10 3/uL      Absolute Immature Granulocyte 0.00 x10 3/uL     Prothrombin time/INR [[id number]]  (Abnormal) Collected:  05/26/14 0746    Specimen Information:  Blood Updated:  05/26/14 0819     PT 20.7 (H) sec      PT INR 1.8 (H)      PT Anticoag. Given Within 48 hrs. warfarin ([place]ouma     Glucose Whole Blood - POCT [[id number]]  (Abnormal) Collected:  05/26/14 0645     POCT - Glucose Whole blood 151 (H) mg/dL Updated:  60/63/01 6010          Rads:   Radiological Procedure reviewed.    Radiology Results (24 Hour)     ** No results found for the last 24 hours. **          Physical Exam:   Temp:  [97.8 F (36.6 C)-98.5 F (36.9 C)] 98.4 F (36.9 C)  Heart Rate:  [81-117] 105  Resp Rate:  [12-40] 35  BP: (95-110)/(59-72) 106/59 mmHg    Vital Signs:  Filed Vitals:    05/27/14 0400   BP: 106/59   Pulse: 105   Temp: 98.4 F (36.9 C)   Resp: 35   SpO2: 98%     I/O:  Intake and Output Summary (Last 24 hours) at Date Time  I/O last 3  completed shifts:  In: 200 [P.O.:200]  Out: 800 [Urine:800]    Output:     Unmeasured UOP x 7    Nutrition:   Orders Placed This Encounter   Procedures   . Diet consistent carbohydrate       Physical Exam:  Physical Exam   Constitutional: She is oriented to person, place, and time. She appears well-developed and well-nourished. No distress.   HENT:   Head: Normocephalic and atraumatic.   Mouth/Throat: Oropharynx is clear and moist.   Eyes: Conjunctivae and EOM are normal. Pupils are equal, round, and reactive to light.   Neck: Normal range of motion. Neck supple. No JVD present.   Cardiovascular: Normal rate, regular rhythm, normal heart sounds and intact distal pulses.  Exam reveals no gallop and no friction rub.    No murmur heard.  Pulmonary/Chest: Effort normal and breath sounds normal. No  respiratory distress. She has no wheezes.   Abdominal: Soft. Bowel sounds are normal. She exhibits no distension. There is no tenderness.   Musculoskeletal: Normal range of motion. She exhibits edema (Small amt left thigh) and tenderness (left thigh).   Neurological: She is alert and oriented to person, place, and time. No cranial nerve deficit. Coordination normal.   Skin: Skin is warm and dry. No rash noted. No erythema.   LLE hematoma smaller than Sharpie marker outline, soft   Psychiatric: She has a normal mood and affect. Her behavior is normal. Judgment and thought content normal.   Nursing note and vitals reviewed.    Shanon Brow, AGACNP-BC  [phone]      Attending Attestation:

## 2014-05-27 NOTE — PT Progress Note (Signed)
[facility]   Physical Therapy Treatment/Discharge  Patient:  Lisa Hampton MRN#:  [id number]  Unit: [facility] TOWER 3  Bed: F341/F341.01    Discharge Recommendations:   D/C Recommendations: Home with supervision   DME Recommendations: Front wheel walker     Assessment:   Pt presents with improved functional mobility, balance, and activity tolerance. Pt demonstrated safe technique for transfers and gait with RW. Performed stairs with crutch and rail, demonstrating good technique; no LOB or instability noted. Educated on proper sequencing with good carryover. At this time pt has met all goals and does not require additional acute PT services. D/C from PT.     Assessment: Decreased functional mobility, Gait impairment     Prognosis: Good  Risks/Benefits/POC Discussed with Pt/Family: With patient  Patient left without needs and call bell within reach. RN notified of session outcome.     Treatment Activities: gait, transfer, stair training    Educated the patient to role of physical therapy, plan of care, goals of therapy and safety with mobility and ADLs, energy conservation techniques, discharge instructions, home safety.    Plan:   Treatment/Interventions: Gait training, Equipment eval/education, Compensatory technique education        Discharge from PT services           Precautions and Contraindications:    LLE WBAT    Updated Medical Status/Imaging/Labs: Reviewed    Subjective: "I feel safer with the walker."        Pain Assessment  Pain Assessment: Numeric Scale (0-10)  Pain Score: 3-mild pain  Pain Location:  (L Thigh)  Pain Intervention(s): Repositioned    Patient's medical condition is appropriate for Physical Therapy intervention at this time.  Patient is agreeable to participation in the therapy session. Nursing clears patient for therapy.    Objective:   Observation of Patient/Vital Signs:  Patient is in bed with telemetry and SCD's in place.       Functional  Mobility:  Supine to Sit: Modified Independent  Scooting to EOB: Modified Independent  Sit to Supine: Supervision  Sit to Stand: Supervision  Stand to Sit: Supervision       Ambulation:  Ambulation: Supervision  Ambulation Distance (Feet): 250 Feet  Pattern: L decreased stance time;decreased cadence;decreased step length;Step through  Stair Management: Stand by Assist  Number of Stairs: 10                    Patient Participation: Excellent  Patient Endurance: Good    Patient left with call bell within reach, all needs met, SCDs donned on RLE, fall mat in place, bed alarm n/a RN in room,  and all questions answered. RN notified of session outcome and patient response.     Goals:  Goals  Goal Formulation: With patient  Time for Goal Acheivement: 1 visit  Pt Will Transfer Bed/Chair: with rolling walker, modified independent, to maximize functional mobility and independence- Goal Met  Pt Will Ambulate: > 200 feet, with rolling walker, modified independent, to maximize functional mobility and independence-Goal Met  Pt Will Go Up / Down Stairs: 1 flight, modified independent, With rail, With crutches, to maximize functional mobility and independence- performed SBA with good technique; no LOB or instability noted. Pt will have someone at home to assist      Renato Gails, PT,DPT pager [phone] 05/27/2014 11:47 AM       Time of Treatment  PT Received On: 05/27/14  Start Time: 1000  Stop Time: 1035  Time Calculation (min): 35 min  Treatment # 1 out of 1 visit

## 2014-05-27 NOTE — Discharge Instructions (Addendum)
Call your cardiologist, Dr. Trish Mage, on Monday morning for instructions regarding your anticoagulation and INR monitoring.         TRAUMA SERVICE DISCHARGE INSTRUCTIONS    [address], [address]  [place], [place]  [address]  Phone [phone], [place] [phone]      Discharge Instructions and Follow Up Information:  -Your primary physician team during your stay was the Acute Care Surgery service (ACS) (also known as Trauma), whose surgeons specialize in General Surgery, Trauma Surgery, and Critical Care. Our surgeons are Board Certified in both General Surgery and Surgical Critical Care.    You have been provided with a list of doctors (see above) who treated you during your hospitalization.  Not all patients need to follow up with the surgeon.  If instructed to do so, please call [phone] within 48 hours of your discharge To make a Clinic appointment for the following date:  06/08/2014     The address is [address].     If you have forms that need a doctor's signature:  - If you have insurance forms, FMLA forms, or worker's compensation forms that need to be filled out by Designer, industrial/product, please mail or fax the forms to our office at the following address:  Trauma Services  [address], [address]  [place], [place] [address]  Fax: [phone]    Pain Medication  The ACS service can manage your general postoperative or post-trauma pain. If your pain is from injuries or surgery that was handled by another surgery team or consultant, such as Orthopedics, Neurosurgery, or Spine surgery, we kindly request that you call that surgeon's office to address your pain. If you have follow-up with the Trauma Service, please call the Trauma Service Office for medication refills at [phone].   Please plan accordingly as refills can take up to 24-48 hours.    Taking Pain Medications  It is important to take your medications on time and never take more than the prescribed amount.  Use your pain  medication only as directed. Take only the medication that your health care provider tells you to take.  Take your pain medications with some food to avoid an upset stomach.  Remember medications take time to work.  Most pain relievers taken by mouth take at least 20-30 minutes to take effect.  So take your medication at regular times as directed.  Don't wait until the pain gets bad to take it.  Try to time your medication so that you take it before the beginning of an activity, such as dressing or sitting at the table for dinner.  Taking your medication at night may help you get a good night's rest.  If the pain lessens, try taking your pain medication less often. Start by trying to eliminate a dose of pain medication at a time of day when you experience less pain.  If you are not able to completely eliminate the dose, try replacing the dose with a dose of acetaminophen (Tylenol), unless you have been directed not to use acetaminophen use by a physician. In this way, you can slowly wean your use of pain medication as your pain decreases, continuing to eliminate or replace other doses as described above, until you no longer need to take any medicine for pain.   Constipation is a common side effect of pain medications.  Eating lots of fruits and vegetables and drinking lots of water helps relieve constipation.  You should be taking  a stool softener (ex. Colace) as long as you are on any narcotic pain medication.  You should be having a bowel movement at least every 2-3 days.  If not, then proceed with over the counter laxatives (Senokot -S/Miralax), enemas, or suppositories to fix the constipation. You can obtain these at your local pharmacy if you develop constipation.     Avoid drinking alcohol while taking pain medicine.    Avoid driving or operating machinery while taking pain medication.   Please keep track of how many pills you have left so you will not completely run out before you need a refill.     Call  your doctor if you notice any of these symptoms:    . Nausea, vomiting, diarrhea, lasting constipation, or stomach cramps  . Breathing problems or a fast heart rate  . Feeling very tired, sluggish, or dizzy  . Skin rash  . Pain that is not being treated by the prescribed amount of medication            Home Health Discharge Information      [employer]:  Name: [employer] Phone #: [phone]   Equipment Ordered: Rolling walker delivered to patients room.

## 2014-05-29 ENCOUNTER — Encounter: Payer: Self-pay | Admitting: Psychiatric/Mental Health

## 2014-06-02 ENCOUNTER — Ambulatory Visit (INDEPENDENT_AMBULATORY_CARE_PROVIDER_SITE_OTHER): Payer: Self-pay | Admitting: Nurse Practitioner

## 2014-06-15 ENCOUNTER — Ambulatory Visit: Payer: Medicare Other

## 2014-06-17 ENCOUNTER — Ambulatory Visit (INDEPENDENT_AMBULATORY_CARE_PROVIDER_SITE_OTHER): Payer: Medicare Other

## 2014-06-17 VITALS — BP 122/77 | HR 93 | Wt 175.0 lb

## 2014-06-17 DIAGNOSIS — F1090 Alcohol use, unspecified, uncomplicated: Secondary | ICD-10-CM

## 2014-06-17 DIAGNOSIS — F329 Major depressive disorder, single episode, unspecified: Secondary | ICD-10-CM

## 2014-06-17 DIAGNOSIS — F32A Depression, unspecified: Secondary | ICD-10-CM

## 2014-06-17 DIAGNOSIS — F1099 Alcohol use, unspecified with unspecified alcohol-induced disorder: Secondary | ICD-10-CM

## 2014-06-17 DIAGNOSIS — F1994 Other psychoactive substance use, unspecified with psychoactive substance-induced mood disorder: Secondary | ICD-10-CM

## 2014-06-17 MED ORDER — SERTRALINE HCL 50 MG PO TABS
ORAL_TABLET | ORAL | Status: DC
Start: 2014-06-17 — End: 2014-07-13
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
## 2014-06-17 NOTE — Patient Instructions (Addendum)
For depressive sxs:  -Take Paxil 20mg  1/2 tablet (dose=10mg ) x 1 week  -At the same time start sertraline 25mg  daily x1 week (1/2 of 50mg  tab)  -After 1 week:  STOP Paxil and Increase to sertraline 50mg  (full pill)  -Continue Wellbutrin XL 150mg  daily  -Discontinue Ativan, as you no longer are reporting anxiety  -Congrats for abstaining from alcohol!  -Go to lab for B12         Depression  Depression is one of the most common mental health problems today. It is not just a state of unhappiness or sadness. It is a true disease. The cause seems to be related to a decrease in chemicals that transmit signals in the brain. Having a family history of depression, alcoholism or suicide increases the risk. Chronic illness, chronic pain, migraine headaches and high emotional stress also increase the risk.  Depression can cause many different symptoms, such as:  -- Loss of appetite  -- Over-eating  -- Not being able to sleep  -- Sleeping too much  -- Tiredness not related to physical exertion  -- Restlessness or irritability  -- Slowness of movement or speech  -- Feeling depressed or withdrawn  -- Loss of interest in things you once enjoyed  -- Difficulty in concentrating, poor memory, have trouble making decisions  -- Thoughts of harming or killing oneself, or thoughts that life is not worth living  -- Low self-esteem  The best treatment for depression is a combination of medicine and psychotherapy. Antidepressant medicines can reduce suffering and can improve the ability to function during the depressed period. Therapy can offer emotional support and help you understand emotional factors that may be causing the depression.  Home Care:  1) Be kind to yourself. Make it a point to do things that you enjoy (gardening, walking in nature, going to a movie, etc.). Reward yourself for small successes.  2) Take care of your physical body. Eat a balanced diet (low in saturated fat and high in fruits and vegetables). Establish an  exercise plan at least 3 times a week for 30 minutes. Even mild-moderate exercise (like brisk walking) can make you feel better.  3) Avoid alcohol, which can make depression worse.  Follow-Up  with your doctor as advised. It is important to keep in contact with a health care provider until your symptoms begin to improve.  Get Prompt Medical Attention  if any of the following occur:  -- Feeling extreme depression, fear, anxiety, or anger toward yourself or others  -- Feeling out of control  -- Feeling that you may try to harm yourself or another  -- Hearing voices that others do not hear  -- Seeing things that others do not see  -- Can't sleep or eat for 3 days in a row   2000-2015 [employer] [address] [place], [place] [address]. All rights reserved. This information is not intended as a substitute for professional medical care. Always follow your healthcare professional's instructions.            Alcohol Abuse  Alcoholic drinks are harmful when you have too many of them. Drinking that disrupts your life is called alcohol abuse. Alcohol abuse can hurt your relationships with others. You may lose friends, a spouse, or even your job. You may be abusing alcohol if any of the following are true for you:   Duties at home or with child care suffer because of drinking.   Duties at work or in  school suffer because of drinking.   You have missed work or school because of drinking.   You use alcohol while driving or operating machinery.   You have legal problems such as arrests due to drinking.   You keep drinking even though it causes serious problems in your life.  Alcohol abuse can cause health problems. Too much alcohol can cause disease of the liver and pancreas. It can damage your brain and your heart. It raises your risk of cancer. It can lead to sexual problems and make it hard to conceive children. Alcohol abuse in pregnancy can hurt the baby. Alcohol affects how you think and respond. You  are more likely to die in an accident or fire if you have been drinking.  Home Care   Admit you have a problem with alcohol.   Ask for help from your healthcare provider as well as trusted family members or close friends.   Get help from people trained in dealing with alcohol abuse. This may be individual counseling or group therapy, or it may be a supervised alcohol treatment program.   Join a self-help group for alcohol abuse such as Alcoholics Anonymous (AA).   Avoid people who abuse alcohol or tempt you to drink.  Follow Up  as advised by the doctor or our staff. Contact these groups to get help:   Alcoholics Anonymous (AA): Go to SalaryStart.tn or check the phone book for meetings near you.   National Alcohol and Substance Abuse Information Center [facility]): (205) 218-9208 www.addictioncareoptions.com   [employer] on Alcoholism and Drug Dependence ([employer]ADD): 800-NCA-CALL 336 275 0774) www.ncadd.org   Al-Anon: 888-4AL-ANON (478-2956) www.al-anon.org  Get Prompt Medical Attention  if you have:   Confusion   Hallucinations (seeing, hearing, or feeling things that aren't there)   Extreme drowsiness or inability to awaken   Increasing upper abdominal pain   Repeated vomiting, vomiting blood, or black or tarry stools   Severe shakiness or fast heart rate   Seizure (convulsion)   2000-2015 [employer] [address] [place], [place] [address]. All rights reserved. This information is not intended as a substitute for professional medical care. Always follow your healthcare professional's instructions.

## 2014-06-18 ENCOUNTER — Ambulatory Visit (INDEPENDENT_AMBULATORY_CARE_PROVIDER_SITE_OTHER): Payer: Medicare Other | Admitting: Surgery

## 2014-06-18 ENCOUNTER — Encounter (INDEPENDENT_AMBULATORY_CARE_PROVIDER_SITE_OTHER): Payer: Self-pay | Admitting: Surgery

## 2014-06-18 VITALS — BP 124/89 | HR 96 | Temp 96.7°F | Wt 176.2 lb

## 2014-06-18 DIAGNOSIS — S8012XD Contusion of left lower leg, subsequent encounter: Secondary | ICD-10-CM

## 2014-06-18 DIAGNOSIS — G8911 Acute pain due to trauma: Secondary | ICD-10-CM

## 2014-06-18 MED ORDER — HYDROCODONE-ACETAMINOPHEN 5-325 MG PO TABS
1.0000 | ORAL_TABLET | Freq: Four times a day (QID) | ORAL | Status: DC | PRN
Start: 2014-06-18 — End: 2014-09-09

## 2014-06-18 MED ORDER — BUPROPION HCL ER (XL) 150 MG PO TB24
150.0000 mg | ORAL_TABLET | Freq: Every morning | ORAL | Status: DC
Start: 2014-06-18 — End: 2014-07-13

## 2014-06-18 MED ORDER — BUPROPION HCL ER (XL) 150 MG PO TB24
150.0000 mg | ORAL_TABLET | Freq: Every morning | ORAL | Status: DC
Start: 2014-06-18 — End: 2014-09-09

## 2014-06-18 NOTE — Patient Instructions (Signed)
Start outpatient Physical Therapy as soon as possible to improve range of motion of left leg  Encourage frequent ambulation  Refill provided for Norco as needed for pain  Call Clinic if you have fever, chills, increasing pain in left leg  Return to Clinic on 07/07/2014 for follow up with Dr. Noreene Filbert to evaluate your left leg

## 2014-06-18 NOTE — Progress Notes (Signed)
Subjective:   Lisa Hampton is a 59 y.o. female who is here for had concerns including Trauma.   68F with left leg hematoma s/p fall, on Coumadin for AF and CVA presents today for follow up. C/o continued pain left thigh through left calf, relieved with Norco. States she cannot bend left knee, and has not scheduled outpatient PT as instructed on discharge, because she though she should wait until she was better able to move the left leg. She is back on Coumadin, states she is subtherapeutic but is being managed by her Cardiologist. Denies f/c, n/v, dizziness/lightheadedness, tolerating regular diet and having regular BM.    Past Medical History   Diagnosis Date   . Atrial fibrillation    . Hyperlipidemia    . A-fib    . CVA (cerebral vascular accident)    . Hypertension    . Hyperlipemia    . Diabetes mellitus    . Knee joint injury      torn ligaments     Past Surgical History   Procedure Laterality Date   . Tonsillectomy     . Tonsillectomy and adenoidectomy     . Hysterectomy       Family History   Problem Relation Age of Onset   . Bipolar disorder Sister    . Bipolar disorder Sister      History     Social History   . Marital Status: Divorced     Spouse Name: N/A     Number of Children: N/A   . Years of Education: N/A     Occupational History   . Not on file.     Social History Main Topics   . Smoking status: Never Smoker    . Smokeless tobacco: Not on file   . Alcohol Use: No   . Drug Use: No   . Sexual Activity: Not on file     Other Topics Concern   . Not on file     Social History Narrative    ** Merged History Encounter **          Review of patient's allergies indicates no known allergies.  Current Outpatient Prescriptions   Medication Sig Dispense Refill   . acetaminophen (TYLENOL) 325 MG tablet Take 2 tablets (650 mg total) by mouth every 6 (six) hours as needed for Pain.     . Ascorbic Acid (VITAMIN C) 1000 MG tablet Take 1,000 mg by mouth daily.     Marland Kitchen aspirin 81 MG chewable tablet Chew 81 mg by  mouth daily.     Marland Kitchen buPROPion XL (WELLBUTRIN XL) 150 MG 24 hr tablet Take 1 tablet (150 mg total) by mouth every morning. 30 tablet 2   . Cholecalciferol (VITAMIN D) 1000 UNIT tablet Take 1,000 Units by mouth daily.     Marland Kitchen diltiazem (CARDIZEM CD) 180 MG 24 hr capsule Take 180 mg by mouth daily.     Marland Kitchen HYDROcodone-acetaminophen (NORCO) 5-325 MG per tablet Take 1 tablet by mouth every 6 (six) hours as needed for Pain. 40 tablet 0   . lisinopril (PRINIVIL,ZESTRIL) 20 MG tablet Take 20 mg by mouth daily.     . metFORMIN (GLUCOPHAGE-XR) 500 MG 24 hr tablet Take 500 mg by mouth every morning with breakfast.     . Omega-3 Fatty Acids (OMEGA-3 FISH OIL) 500 MG Cap Take by mouth.     Marland Kitchen PARoxetine (PAXIL) 20 MG tablet Take 1 tablet (20 mg total) by mouth every morning. (  Patient taking differently: Take 20 mg by mouth every morning. Takes at night  ) 30 tablet 2   . rosuvastatin (CRESTOR) 40 MG tablet Take 40 mg by mouth nightly.     . warfarin (COUMADIN) 3 MG tablet Take 3 mg by mouth daily. Will have INR checked Friday 1/29.  Dose being increased to get INR to target of 2-3.    Took 5mg  Monday, 6mg  Tuesday, 4mg  tonight, 4mg  tomorrow, and then have checked Friday.       Marland Kitchen LORazepam (ATIVAN) 0.5 MG tablet Take 0.5 mg by mouth every 6 (six) hours as needed for Anxiety.     . metFORMIN (GLUCOPHAGE) 500 MG tablet Take 250 mg by mouth 2 (two) times daily with meals.     Marland Kitchen oxyCODONE (ROXICODONE) 5 MG immediate release tablet Take 1 tablet (5 mg total) by mouth every 4 (four) hours as needed for Pain. 60 tablet 0   . senna-docusate (PERICOLACE) 8.6-50 MG per tablet Take 2 tablets by mouth 2 (two) times daily.     . sertraline (ZOLOFT) 50 MG tablet Take 1/2 tablet daily x 7 days, then increase to full tablet (50mg ) 30 tablet 1     No current facility-administered medications for this visit.       Review of Systems   Constitutional: Positive for activity change.   Respiratory: Negative.    Cardiovascular: Negative.    Gastrointestinal:  Negative.    Musculoskeletal: Positive for joint swelling (Right knee).   Skin: Negative.    Neurological: Negative.    Hematological: Negative.    All other systems reviewed and are negative.    Objective:     Filed Vitals:    06/18/14 1232   BP: 124/89   Pulse: 96   Temp: 96.7 F (35.9 C)     Physical Exam   Constitutional: She is oriented to person, place, and time. She appears well-developed and well-nourished. No distress.   Cardiovascular: Normal rate, normal heart sounds and intact distal pulses.  Exam reveals no gallop and no friction rub.    No murmur heard.  Pulmonary/Chest: Effort normal and breath sounds normal. No respiratory distress. She has no wheezes.   Musculoskeletal:   Cannot bend left knee, wiggles toes, +dorsiflexion left foot   Neurological: She is alert and oriented to person, place, and time.   Skin: Skin is warm and dry.   Left knee ecchymotic, edematous, blanchable   Psychiatric: She has a normal mood and affect. Her behavior is normal.     Assessment:     1. Leg hematoma, left, subsequent encounter     2. Acute pain due to trauma         Previous RAD Imaging:  Tibia Fibula Left Ap And Lateral    05/25/2014   Indications: Trauma  AP and lateral views of the left lower leg were performed. This demonstrated marked soft tissue swelling anterior to the patella. There is no evidence of acute bony injury. The ankle mortise is anatomic.     05/25/2014    Soft tissue swelling anterior to the patella. No evidence of acute bony injury.  Remer Macho, MD  05/25/2014 12:15 PM     Ct Angiogram Lower Extremity Bilateral    05/25/2014   CLINICAL HISTORY: Trauma to the left lower leg. Status post fall from stairs. Left lower extremity pain and swelling.  TECHNIQUE: CTA of bilateral lower extremities was performed following uneventful intravenous administration of 100 cc Visipaque 320 contrast.  MIP and 3-D reconstructions obtained.  FINDINGS: A large subcutaneous hematoma is seen anteriorly from the distal  left thigh into the prepatellar space/bursa. There are some fluid hematocrit levels within this hematoma. A small arterial contrast blush is seen along the posterior superior margin of the hematoma suggesting site of active bleed. This measures up to 3.3 cm in AP thickness and extends approximately 18 cm craniocaudad length on sagittal reconstructions.  Atherosclerotic calcification of the included distal abdominal aorta with no aneurysm. Atherosclerotic calcifications and plaque of the iliac arteries with no significant stenosis. The common, external and internal iliac arteries are patent bilaterally.  Left lower extremity: Diffuse atherosclerotic disease of the lower extremity arterial system. The common femoral, superficial femoral, popliteal arteries are patent with no significant stenosis. Profunda femoral artery is patent. The tibial peroneal trunk, anterior tibial, posterior tibial and peroneal arteries are patent into the ankle and foot.  Right lower extremity: Diffuse atherosclerotic disease of the lower extremity arterial system. The common femoral, superficial femoral and popliteal arteries are patent with no significant stenosis. Profunda femoral artery is patent. The tibial peroneal trunk, anterior tibial, posterior tibial and peroneal arteries are patent into the ankle and foot.  No bowel dilatation in the included lower abdomen and pelvis. Normal appendix. Colonic diverticulosis. No free fluid. No adenopathy. Bladder appears unremarkable. Degenerative changes in the lower lumbar spine. No joint effusion seen at the knees.     05/25/2014    1. Large subcutaneous hematoma anteriorly in the left distal thigh extending into the prepatellar space/bursa. Small contrast blush along the anterior superior margin of the hematoma compatible with site of active bleed. 2. Diffuse atherosclerotic disease but no significant stenosis or occlusion of the lower extremity arterial system. Three-vessel runoff bilaterally.  3. Colonic diverticulosis.  COMMENT: This urgent result was discussed with and acknowledged by Dr. Betsey Holiday, at 1300 hours on 05/25/2014.  Kennyth Lose, MD  05/25/2014 1:18 PM     Focused Abdominal Sonogram (fast)    05/25/2014   The attached image(s) (individually and collectively, "Image") is/are from  emergency department visit encounter(s) occurring outside the radiology  department at [facility]. This examination is performed by clinical staff. The  image(s) is/are not reviewed by radiology personal and there is no  radiology interpretation provided. For a report for this procedure please  refer to the trauma physician notes in Epic.     Patient Active Problem List    Diagnosis Date Noted   . Leg hematoma, left, initial encounter    . Leg hematoma 05/25/2014   . Warfarin anticoagulation 05/25/2014   . Acute pain due to trauma 05/25/2014   . Peripheral vascular disease 05/25/2014   . Chronic atrial fibrillation 05/25/2014     Plan:   Instructed to schedule outpatient PT as soon as possible to begin therapy for left leg  Refill for Norco provided today  Instructed to call clinic for fever, chills, increasing pain in left leg  Returning to clinic 07/07/14 for follow up with Dr. Gus Rankin, AGACNP-BC  [phone]        Return in about 3 weeks (around 07/07/2014).     .  Attending Attestation:     Trauma & Acute Care Surgery Attending:     I have personally seen and examined this patient.  I agree with the clinical information, including the physical exam, patient history, and planning as documented by the Resident. In addition, I have edited this note to reflect my  findings and plan as well as to incorporate any new data.  I reviewed the pertinent labs and radiology studies, and I developed the assessment and plan.     Left knee remains swollen. She has taken measurements and its unchanged.  Pt has not called to have PT.  No fevers.  Ambulating.  On exam left knee swollen, mild blanching erythema. She tolerates some  range of motion with no issues.    It looks like thelarge hematoma has liquified some but remains not absorbed.    I instructed her to go to the ED if she develops fevers and signs of infection.    Will continue pain control, and she will call to schedule PT at least 3x/weelk.  Also I instructed her on ROM several times daily after knee is warmed up on shower or with warm compresses.    F/u on 2/16.    Elenore Paddy, MD  Trauma, Acute Care Surgery  Surgical Critical Care

## 2014-06-18 NOTE — Addendum Note (Signed)
Addended by: Forde Dandy on: 06/18/2014 05:24 PM     Modules accepted: Orders, Medications

## 2014-06-29 ENCOUNTER — Inpatient Hospital Stay: Payer: Medicare Other | Attending: Internal Medicine | Admitting: Rehabilitative and Restorative Service Providers"

## 2014-06-29 ENCOUNTER — Encounter: Payer: Self-pay | Admitting: Rehabilitative and Restorative Service Providers"

## 2014-06-29 VITALS — BP 128/96 | HR 76

## 2014-06-29 DIAGNOSIS — S8002XD Contusion of left knee, subsequent encounter: Secondary | ICD-10-CM | POA: Insufficient documentation

## 2014-06-29 DIAGNOSIS — M25562 Pain in left knee: Secondary | ICD-10-CM | POA: Insufficient documentation

## 2014-06-29 DIAGNOSIS — W108XXD Fall (on) (from) other stairs and steps, subsequent encounter: Secondary | ICD-10-CM | POA: Insufficient documentation

## 2014-06-29 DIAGNOSIS — Z7901 Long term (current) use of anticoagulants: Secondary | ICD-10-CM | POA: Insufficient documentation

## 2014-06-29 NOTE — PT/OT Plan of Care (Signed)
Plan of Care  [facility] Medicare Provider #: [phone]                Patient Name: Lisa Hampton  MRN: [id number]  DOI: Onset of Problem / Injury: 05/24/14 DOS:    SOC: 06/29/2014     Diagnosis:     ICD-10-CM    1. Left knee pain M25.562      HICN Medicare #: Medicare Sub. Num: [id number]  Medicare Sub. Num: [id number]     G Codes: Evaluation / Current: G8978 Mobility CL (60-80) Goal: G8979 Mobility CJ (20-40)  Primary Functional Deficit: Mobility: Walking and Moving Around G-codes determined by: LEFS    ASSESSMENT: the patient is a 59 y.o. female who requires Physical Therapy for the following:  Impairments: decreased L knee and bilat ankle A/PROM, decreased L knee flexion/ext, bilat hip strength, edema, and L knee pain  The patient presents with L knee hematoma secondary to fall on knee.    Functional Limitations: Difficulty getting in and out of car, walking longer than 1 block, caring for grandchildren, pain with ascending and descending stairs (unable to perform reciprocally)    Plan Of Care: NMR, Electrical Stimulation, Instruction in HEP, Ultrasound, Therapeutic Exercise, Balance/Gait training and Soft Tissue ITB, edema management/Joint Mobilization patellar and TF Grade 1-4  Frequency/Duration: 2 times a week for 8 weeks. Anticipated D/C date: 08/24/14    Certification Dates: From: 06/29/2014  To: 08/24/14    Goals:   End of Certification   Date (Body Area, Impairment Goal, Functional   Activity, Target Performance) Time Frame Status Date/  Initial   06/29/2014   Patient will demonstrate independence in prescribed HEP with proper form, sets and reps for safe discharge to an independent program.  8 wks      06/29/2014  Patient will improve LEFS from 29% to 65% for improved function with ADLs.  8 wks     06/29/2014  Patient will improve L knee flexion AROM to 110 deg in order to ascend and descend stairs reciprocally.  8 wks     06/29/2014  Patient will improve L hip abd and knee ext strength to 5/5 in order to walk for 2 blocks  without pain.  8 wks       Signature:   Iren Whipp LeTard, SPT  Jayme Cloud, [place], DPT, [id number] Date: 06/29/2014    Signature: Lana Fish, MD ___________________________ Date:

## 2014-06-29 NOTE — PT/OT Exercise Plan (Signed)
Name: Lisa Hampton  Referring Physician: Lana Fish, MD  Diagnosis:   Encounter Diagnosis   Name Primary?   . Left knee pain Yes        Precautions:  Pt is taking warfarin  Past Medical History   Diagnosis Date   . Atrial fibrillation    . Hyperlipidemia    . A-fib    . CVA (cerebral vascular accident)    . Hypertension    . Hyperlipemia    . Diabetes mellitus    . Knee joint injury      torn ligaments   . Depression        DOI: Onset of Problem / Injury: 05/24/14  DOS:      MD Follow-up:         Exercise Flow Sheet    Exercise Specifics                RC bike rocking              HS stretch                   Wedge stretch               4 way hip   GTB                Mini squats               LAQ                 SAQ                   Heel slides                 Quad sets                                                 Home Exercise Program   Heel slides, quad sets, AAROM knee flex, S/L Add, abd, supine SLR              (Initials = supervised exercise by clinician)

## 2014-06-29 NOTE — PT/OT Therapy Note (Signed)
INITIAL EVALUATION (Knee)  Name: Lisa Hampton   MRN:  [id number]    Age: 59 y.o.   Occupation: disability due to afib    Referring Physician: Lana Fish, MD  MD recheck: Unknown DOS:     DOI: Onset of Problem / Injury: 05/24/14   SOC: 06/29/2014  Diagnosis (Treating/Medical):     ICD-10-CM    1. Left knee pain M25.562        # of Authorized Visits:   Visit #   today       SUBJECTIVE:  Mechanism of Injury: Pt fell down stairs 05/24/14 and fell directly on medial side of knee. After fall, pt went to bed, but woke up in morning with significant hematoma (due to her taking blood thinners) and EMT had to come out to take her to hospital. Pt reported no weakness from stroke (affected occular N only) in 2008. Currently, pt is having difficulty walking longer than 1 block, cannot walk around [employer] without WC, occasionally can walk up and down stairs one at a time if she has taken some pain medication. Everything is pain limited. Pt also has difficulty getting in and out of car and caring for grandchildren. Pt reported some locking and clicking from ski injury. Hx: ACL tear, medial and lateral tears (arthroscope) 30 yrs ago  PLOF: previously independent with ambulation, going up and down stairs reciprocally, able to care for grandchildren  Patient's reason for seeking PT /: return to prior level of function  Past Medical History:   Past Medical History   Diagnosis Date   . Atrial fibrillation    . Hyperlipidemia    . A-fib    . CVA (cerebral vascular accident)    . Hypertension    . Hyperlipemia    . Diabetes mellitus    . Knee joint injury      torn ligaments   . Depression      Medications: .  warfarin (COUMADIN) 3 MG tablet        Other Treatment/Prior Therapy: PT in the hospital  Prior Hospitalization: hospitalized  Involved Side:   Tests: X ray and CT scan    Outcome Measure:         % Pain Score: 43%   Rate Satisfaction with Current Function: 3/10  Patient lives with:   lives with daughter    OBJECTIVE:  Vitals: BP:  (!) 128/96 mmHg Heart Rate: 76      Observation/Posture/Gait/Integumentary:  Observation of posture: Rounded shoulders  Ambulation: without AD  Gait: Slightly antalgic, decreased L knee flexion during swing phase    Girth/Edema:    Initial R Initial L   Mid Patellar 35.5 cm 38 cm   infrapatellar 34 cm 36.5 cm   suprapatellar 36 cm 39.5 cm   (blank fields were intentionally left blank)    IE Integumentary: two scabs noted in suprapatellar region likely due to blisters from healing hematoma. Skin is peeling and discolored.  IE Palpation: Pain to palpation: suprapatellar, ITB, MCL, medial joint line   IE Patellar Mobility: Within Functional Limits     Range of Motion: (degrees)  Initial Uninvolved  AROM R Knee Initial Involved ROM L   136 Flexion 75/90 A/PROM   0 Extension 4 AROM   (blank fields were intentionally left blank)    IE Hip AROM: WFL  IE Ankle AROM: WFL and Limitations decreased DF bilat    IE Flexibility:  Hamstrings (90/90) Restricted Bilateral L -32, R -25  Quadriceps NT   Gastroc Restricted Bilateral, L 5, R 7     Initial   Uninvolved R LE Strength  MMT /5 Initial  Involved L   5 Hip Flexion 4   4 Hip Extension 4   4+ Hip Abduction 4+   4+ Hip Adduction 4-   5 Quadriceps 4 p!   5 Hamstrings 4   5 Ankle Dorsiflexion 4+   5 Ankle Plantarflexion    (blank fields were intentionally left blank)    Balance:  IE SLS R: Eyes Open (EO): NT sec.  IE SLS L: Eyes Open (EO):NT  sec.     Special Tests/Neurological Screen:    Unable to perform many of special tests due to limited knee ROM   R L  R L   Lachmans NT NT McMurray's Test NT NT   Anterior Drawer NT (-) though difficult to assess Apley's Test NT NT   Posterior Drawer NT (-) though difficult to assess [facility] Test NT NT   Valgus Stress NT (-) Thomas Test NT NT   Varus Stress NT (-) Obers Test NT NT       Sensation to Light touch: Intact and Absent L anterior knee (suprapatellar)    Treatment Initial Visit:  Evaluation (30 minutes)  TherAct: Patient Education  on what can be expected with therapy sessions, typical tissue healing expectations  Therapeutic exercise with instruction in HEP and provided patient written and illustrated handout Yes  Manual none  Modalities: None  Barriers to rehabilitation: None  Rehab Potential:good  Is patient aware of diagnosis: Yes  For Next Visit Add Game ready, E stim, tibiofemoral joint mobilizations, STM L ITB, as tolerated retest knee special tests    Lisa Hampton, SPT  Lisa Hampton, PT, DPT, [phone]  06/29/2014  Time In/Out:  10:16 - 11:10 Total Treatment Time:  54 minutes

## 2014-07-03 ENCOUNTER — Inpatient Hospital Stay: Payer: Medicare Other

## 2014-07-03 ENCOUNTER — Telehealth: Payer: Self-pay

## 2014-07-03 DIAGNOSIS — M25562 Pain in left knee: Secondary | ICD-10-CM

## 2014-07-03 NOTE — Telephone Encounter (Signed)
Pt called stating she started feeling more irritable on Zoloft 50mg .  She went back to 25mg  yesterday and felt better. I instructed her to keep taking 25mg  x 5 days and then to go up to 50mg  x 5 days.  At f/up appt on 2/22 will reassess regarding further titration of Zoloft.

## 2014-07-03 NOTE — PT/OT Therapy Note (Addendum)
DAILY NOTE   07/03/2014     Name: Lisa Hampton Age: 59 y.o.   MRN:  [id number] Referring Physician: Lana Fish, MD DOI: Onset of Problem / Injury: 05/24/14 DOS:   SOC: 07-13-2014   Diagnosis (Treating/Medical):     ICD-10-CM    1. Left knee pain M25.562            Certification Status Ends: 2014/07/13 - 2014/09/07   G-Codes required on visit # 10      Time In/Out: 3:15 - 4:10 Total Time:  23' Visit Number:  2    # of Authorized Visits: 16 Visit #: 2    Subjective:  Abiageal reports 1/10 pain level.  Only Tylenol now.      Objective:   Treatment:  Therapeutic Exercise:Per flowsheet to improve: Flexibility/ROM, Stabilization and Strength   Assisted Warm-up- History taken  Modifications: See flowsheet for specifics. (Verbal, Visual and Tactile cues as noted on flowsheet for proper technique and speed with TE)      Manual Therapy:   Supine, tibiofemoral joint mobilizations  R S/L, STM L ITB      Therapeutic Activity:  N/A today    NMR:  Explanation of each ms group we are exercising or stretching with today's PT gym additions.  Quad setting importance for SLR  Balance, proprioception training with 4-way hip to improved stability, safety with gait  Mini squat form        Current Measurements (ROM, Strength, Girth, Outcomes, etc.):     OUTCOMES IE    LEFS 29%    PSFS Pain 43%    PSFS Sat 3/10          Modalities: Electrical Stimulation with Ice: Premod 15 min. Location L M/L knee Position Recumbant and Vasopneumatic Medium 15 min. Location L knee Position Recumbant  Therapy Rationale: Decrease Pain and Decrease Inflammation       Assessment (response to treatment):   Pt seemed fearful of the new exercises we did here with her 2nd PT session but she did really well and I explained pain free and comfortable ranges.  Pt really liked the compression and cold of Game Ready addition today.    Progress towards functional goals:  HEP compliant   Certification Dates: From: 07/13/14  To: 09-07-14    Goals:   End of Certification    Date   (Body Area, Impairment Goal, Functional   Activity, Target Performance)  Time Frame  Status  Date/  Initial    Jul 13, 2014  Patient will demonstrate independence in prescribed HEP with proper form, sets and reps for safe discharge to an independent program.  8 wks      Jul 13, 2014  Patient will improve LEFS from 29% to 65% for improved function with ADLs.  8 wks      July 13, 2014  Patient will improve L knee flexion AROM to 110 deg in order to ascend and descend stairs reciprocally.  8 wks      Jul 13, 2014  Patient will improve L hip abd and knee ext strength to 5/5 in order to walk for 2 blocks without pain.  8 wks        Patient requires continued skilled care to: reduce pain and inflammation, increase strength and ROM and maximize daily function.    Plan:  MD appt on Tues at 3pm  as tolerated retest knee special tests  Continue with Plan of Care    Cecelia Byars, LPTA [id number]  07/03/2014

## 2014-07-03 NOTE — PT/OT Exercise Plan (Addendum)
Name: Lisa Hampton  Referring Physician: Lana Fish, MD  Diagnosis:   Encounter Diagnosis   Name Primary?   . Left knee pain Yes        Precautions:  Pt is taking warfarin  Past Medical History   Diagnosis Date   . Atrial fibrillation    . Hyperlipidemia    . A-fib    . CVA (cerebral vascular accident)    . Hypertension    . Hyperlipemia    . Diabetes mellitus    . Knee joint injury      torn ligaments   . Depression        DOI:    DOS:      MD Follow-up:         Exercise Flow Sheet    Exercise Specifics 07/03/14             C RC  Rocking  Seat 8 LG  5'             HS stretch   stool LG  B 2X30"             Wedge stretch  LG  B 2X30"             4 way hip   GTB on L LE LG  X5ea               Mini squats  LG  X10             LAQ    LG  X10             SAQ    NT             Heel slides  LG  X10             Quad sets  LG  5"X10                                               Home Exercise Program   Heel slides, quad sets, AAROM knee flex, S/L Add, abd, supine SLR review             (Initials = supervised exercise by clinician)

## 2014-07-07 ENCOUNTER — Encounter (INDEPENDENT_AMBULATORY_CARE_PROVIDER_SITE_OTHER): Payer: Self-pay | Admitting: Surgery

## 2014-07-07 ENCOUNTER — Ambulatory Visit (INDEPENDENT_AMBULATORY_CARE_PROVIDER_SITE_OTHER): Payer: Medicare Other | Admitting: Surgery

## 2014-07-07 ENCOUNTER — Inpatient Hospital Stay: Payer: Medicare Other

## 2014-07-07 VITALS — BP 138/94 | HR 89 | Temp 98.5°F | Ht 69.0 in | Wt 176.0 lb

## 2014-07-07 DIAGNOSIS — R791 Abnormal coagulation profile: Secondary | ICD-10-CM

## 2014-07-07 DIAGNOSIS — M25562 Pain in left knee: Secondary | ICD-10-CM

## 2014-07-07 DIAGNOSIS — S8002XD Contusion of left knee, subsequent encounter: Secondary | ICD-10-CM

## 2014-07-07 NOTE — Progress Notes (Signed)
Subjective:   Lisa Hampton is a 59 y.o. female who is here for had concerns including Post-op. S/p mechanical fall on coumadin for PMH of CVA with left knee hematoma. She continues to see PT and tolerating increased ROM. States currently prescribed coumadin 6 mg daily and that recent INR check on 2/15 was 4.6; instructed by cardiologist to skip 2/15 dose and take 3 mg today, and take 6 mg every other day until next INR check. Denies bleeding, syncope, lightheadedness, change in bowel movements.     Past Medical History   Diagnosis Date   . Atrial fibrillation    . Hyperlipidemia    . A-fib    . CVA (cerebral vascular accident)    . Hypertension    . Hyperlipemia    . Diabetes mellitus    . Knee joint injury      torn ligaments   . Depression      Past Surgical History   Procedure Laterality Date   . Tonsillectomy     . Tonsillectomy and adenoidectomy     . Hysterectomy     . Hysterectomy       Family History   Problem Relation Age of Onset   . Bipolar disorder Sister    . Bipolar disorder Sister    . Hypertension Mother    . Hypertension Father    . Diabetes Father      History     Social History   . Marital Status: Divorced     Spouse Name: N/A     Number of Children: N/A   . Years of Education: N/A     Occupational History   . Not on file.     Social History Main Topics   . Smoking status: Never Smoker    . Smokeless tobacco: Not on file   . Alcohol Use: No   . Drug Use: No   . Sexual Activity: Not on file     Other Topics Concern   . Not on file     Social History Narrative    ** Merged History Encounter **          Review of patient's allergies indicates no known allergies.  Current Outpatient Prescriptions   Medication Sig Dispense Refill   . acetaminophen (TYLENOL) 325 MG tablet Take 2 tablets (650 mg total) by mouth every 6 (six) hours as needed for Pain.     . Ascorbic Acid (VITAMIN C) 1000 MG tablet Take 1,000 mg by mouth daily.     Marland Kitchen aspirin 81 MG chewable tablet Chew 81 mg by mouth daily.     Marland Kitchen  buPROPion XL (WELLBUTRIN XL) 150 MG 24 hr tablet Take 1 tablet (150 mg total) by mouth every morning. 30 tablet 2   . Cholecalciferol (VITAMIN D) 1000 UNIT tablet Take 1,000 Units by mouth daily.     Marland Kitchen diltiazem (CARDIZEM CD) 180 MG 24 hr capsule Take 180 mg by mouth daily.     Marland Kitchen lisinopril (PRINIVIL,ZESTRIL) 20 MG tablet Take 20 mg by mouth daily.     . metFORMIN (GLUCOPHAGE-XR) 500 MG 24 hr tablet Take 500 mg by mouth every morning with breakfast.     . Omega-3 Fatty Acids (OMEGA-3 FISH OIL) 500 MG Cap Take by mouth.     . rosuvastatin (CRESTOR) 40 MG tablet Take 40 mg by mouth nightly.     . sertraline (ZOLOFT) 50 MG tablet Take 1/2 tablet daily x 7 days, then increase to  full tablet (50mg ) 30 tablet 1   . warfarin (COUMADIN) 3 MG tablet Take 3 mg by mouth daily. Will have INR checked Friday 1/29.  Dose being increased to get INR to target of 2-3.    Took 5mg  Monday, 6mg  Tuesday, 4mg  tonight, 4mg  tomorrow, and then have checked Friday.       Marland Kitchen buPROPion XL (WELLBUTRIN XL) 150 MG 24 hr tablet Take 1 tablet (150 mg total) by mouth every morning. 30 tablet 2   . HYDROcodone-acetaminophen (NORCO) 5-325 MG per tablet Take 1 tablet by mouth every 6 (six) hours as needed for Pain. 40 tablet 0   . LORazepam (ATIVAN) 0.5 MG tablet Take 0.5 mg by mouth every 6 (six) hours as needed for Anxiety.     . metFORMIN (GLUCOPHAGE) 500 MG tablet Take 250 mg by mouth 2 (two) times daily with meals.     Marland Kitchen oxyCODONE (ROXICODONE) 5 MG immediate release tablet Take 1 tablet (5 mg total) by mouth every 4 (four) hours as needed for Pain. 60 tablet 0   . PARoxetine (PAXIL) 20 MG tablet Take 1 tablet (20 mg total) by mouth every morning. (Patient taking differently: Take 20 mg by mouth every morning. Takes at night  ) 30 tablet 2   . senna-docusate (PERICOLACE) 8.6-50 MG per tablet Take 2 tablets by mouth 2 (two) times daily.       No current facility-administered medications for this visit.       Review of Systems   Constitutional:  Negative.    HENT: Negative.    Eyes: Negative.    Respiratory: Negative.    Cardiovascular: Negative.    Gastrointestinal: Negative.    Endocrine: Negative.    Genitourinary: Negative.    Musculoskeletal: Positive for joint swelling (Left knee). Negative for myalgias and gait problem.   Skin: Negative.    Allergic/Immunologic: Negative.    Neurological: Negative.    Hematological: Negative.    Psychiatric/Behavioral: Negative.      Objective:     Filed Vitals:    07/07/14 1515   BP: 138/94   Pulse: 89   Temp: 98.5 F (36.9 C)   SpO2: 98%     Physical Exam   Constitutional: She is oriented to person, place, and time. She appears well-developed and well-nourished. No distress.   HENT:   Head: Normocephalic and atraumatic.   Right Ear: External ear normal.   Left Ear: External ear normal.   Eyes: EOM are normal. Pupils are equal, round, and reactive to light.   Neck: Normal range of motion. Neck supple. No JVD present. No tracheal deviation present.   Cardiovascular: Normal rate, regular rhythm, normal heart sounds and intact distal pulses.    Pulmonary/Chest: Effort normal and breath sounds normal. No respiratory distress. She exhibits no tenderness.   Abdominal: Soft. Bowel sounds are normal. She exhibits no distension. There is no tenderness.   Musculoskeletal:        Left knee: She exhibits decreased range of motion (able to do full extension and flexion of knee to 80 degrees) and swelling. Erythema: mild. Tenderness: mild.        Left ankle: Normal.        Left lower leg: Normal.        Legs:  Neurological: She is alert and oriented to person, place, and time. No cranial nerve deficit or sensory deficit. She exhibits normal muscle tone.   Skin: Skin is warm and dry. She is not diaphoretic.  Vitals reviewed.       Pictures 1/28:         Pictures today 2/16:        Assessment:     1. Traumatic hematoma of left knee, subsequent encounter     2. Supratherapeutic INR         Previous RAD Imaging:  No results  found.  Patient Active Problem List    Diagnosis Date Noted   . Left knee pain 06/29/2014   . Leg hematoma, left, initial encounter    . Leg hematoma 05/25/2014   . Warfarin anticoagulation 05/25/2014   . Acute pain due to trauma 05/25/2014   . Peripheral vascular disease 05/25/2014   . Chronic atrial fibrillation 05/25/2014      S/p mechanical fall on coumadin for PMH of CVA with left knee hematoma; knee symptoms continue to greatly improved. Also with supratherapeutic INR  Plan:     --Continue physical therapy and Range-of-motion exercises for left knee.  --Continue ace bandage and elevation of joint when sitting down.   --Advised to alternate cold and warm compresses to area.  --Continue to monitor coumadin and INR levels per Dr. Trish Mage, cardiologist    Return in about 5 weeks (around 08/10/2014) for wound check.     Donnelly Stager, PA-C   Trauma Acute Care Surgery   [facility]    Attending Attestation:     Trauma & Acute Care Surgery Attending:     Wound looks better.less swollen.  Pt is doing PT and has better ROM.  She remains on coumadin.  Cont PT, ROM exercises and f/u in 4 weeks.    Elenore Paddy, MD  Trauma, Acute Care Surgery  Surgical Critical Care

## 2014-07-07 NOTE — Patient Instructions (Signed)
--  Continue physical therapy and Range-of-motion exercises for left knee.  --Continue ace bandage and elevation of joint when sitting down.   --Advised to alternate cold and warm compresses to area.  --Continue to monitor coumadin and INR levels per Dr. Trish Mage, cardiologist

## 2014-07-07 NOTE — PT/OT Therapy Note (Signed)
DAILY NOTE   07/07/2014     Name: Lisa Hampton Age: 59 y.o.   MRN:  [id number] Referring Physician: Lana Fish, MD DOI: Onset of Problem / Injury: 05/24/14 DOS:   SOC: 2014-07-02   Diagnosis (Treating/Medical):     ICD-10-CM    1. Left knee pain M25.562            Certification Status Ends: Jul 02, 2014 - August 27, 2014   G-Codes required on visit # 10      Time In/Out: 7:00 - 7:55 Total Time:  72' Visit Number:  3    # of Authorized Visits: 16 Visit #: 3    Subjective:  Lisa Hampton reports saw the MD and she was pleased with decrease swelling and she said I am doing well and I should move it more.  Only Tylenol now - not able to take NSAIDs.  Did not have delayed pain after last PT session.  There is a burning all the time and I get a clicking.    Objective:   Treatment:  Therapeutic Exercise:Per flowsheet to improve: Flexibility/ROM, Stabilization and Strength   Assisted Warm-up- History taken  Modifications: See flowsheet for specifics. (Verbal, Visual and Tactile cues as noted on flowsheet for proper technique and speed with TE)      Manual Therapy:   Supine, tibiofemoral joint mobilizations  R S/L, STM L ITB      Therapeutic Activity:  Practiced stair ascention with green step    NMR:  Explanation of each ms group we are exercising or stretching with today's PT gym additions.  Quad setting importance for SLR  Balance, proprioception training with 4-way hip to improved stability, safety with gait  Mini squat form        Current Measurements (ROM, Strength, Girth, Outcomes, etc.):     OUTCOMES IE    LEFS 29%    PSFS Pain 43%    PSFS Sat 3/10      Range of Motion: (degrees)  Initial Uninvolved  AROM R  Knee  Initial Involved ROM L  07/07/14 L AROM   136  Flexion  75/90 A/PROM  105   0  Extension  4 AROM     (blank fields were intentionally left blank)    Initial   Uninvolved R  LE Strength  MMT /5  Initial  Involved L    5  Hip Flexion  4    4  Hip Extension  4    4+  Hip Abduction  4+    4+  Hip Adduction  4-    5  Quadriceps  4  p!    5  Hamstrings  4    5  Ankle Dorsiflexion  4+    5  Ankle Plantarflexion     (blank fields were intentionally left blank)      Modalities: Electrical Stimulation with Ice: Premod 15 min. Location L M/L knee Position Recumbant and Vasopneumatic Medium 15 min. Location L knee Position Recumbant  Therapy Rationale: Decrease Pain and Decrease Inflammation       Assessment (response to treatment):   Able to make full revolutions on bike.  Discussed swelling is still expected at this time as pt measures herself everyday.  MD was not concerned with swelling.  MD showed pt a picture of her knee previously and it has improved significantly.  Progress towards goal 3 with improved L knee flexion AROM.      Progress towards functional goals:  MD pleased with progress  and improved L knee flex today   Certification Dates: From: 06/29/2014  To: 08/24/14    Goals:   End of Certification    Date  (Body Area, Impairment Goal, Functional   Activity, Target Performance)  Time Frame  Status  Date/  Initial    06/29/2014  Patient will demonstrate independence in prescribed HEP with proper form, sets and reps for safe discharge to an independent program.  8 wks  progress 07/07/14 LG   06/29/2014  Patient will improve LEFS from 29% to 65% for improved function with ADLs.  8 wks      06/29/2014  Patient will improve L knee flexion AROM to 110 deg in order to ascend and descend stairs reciprocally.  8 wks  progress 07/07/14 LG   06/29/2014  Patient will improve L hip abd and knee ext strength to 5/5 in order to walk for 2 blocks without pain.  8 wks        Patient requires continued skilled care to: reduce pain and inflammation, increase strength and ROM and maximize daily function.    Plan:  *Pt will be back tomorrow morn  as tolerated retest knee special tests  Continue with Plan of Care    Cecelia Byars, LPTA [id number]  07/07/2014

## 2014-07-08 ENCOUNTER — Inpatient Hospital Stay: Payer: Medicare Other

## 2014-07-08 DIAGNOSIS — M25562 Pain in left knee: Secondary | ICD-10-CM

## 2014-07-08 NOTE — PT/OT Therapy Note (Signed)
DAILY NOTE   07/08/2014     Name: Lisa Hampton Age: 59 y.o.   MRN:  [id number] Referring Physician: Lana Fish, MD DOI: Onset of Problem / Injury: 05/24/14 DOS:   SOC: 07-25-2014   Diagnosis (Treating/Medical):     ICD-10-CM    1. Left knee pain M25.562            Certification Status Ends: 2014/07/25 - September 19, 2014   G-Codes required on visit # 10      Time In/Out: 7:00 - 7:55 Total Time:  19' Visit Number:  3    # of Authorized Visits: 16 Visit #: 4    Subjective:  Lisa Hampton reports saw the MD and she was pleased with decrease swelling and she said I am doing well and I should move it more.  Only Tylenol now - not able to take NSAIDs.  Did not have delayed pain after last PT session.  There is a burning all the time and I get a clicking.    Objective:   Treatment:  Therapeutic Exercise:Per flowsheet to improve: Flexibility/ROM, Stabilization and Strength   Assisted Warm-up- History taken  Modifications: See flowsheet for specifics. (Verbal, Visual and Tactile cues as noted on flowsheet for proper technique and speed with TE)  Added to HEP, today, 07/08/14:  Mini squats, HR, TR, gastroc/ HS stretch, SAQ    Manual Therapy:   Supine, tibiofemoral joint mobilizations  R S/L, STM L ITB      Therapeutic Activity:  Practiced stair ascention with green step  Swelling to be expected at this time - encouraged ice, elevation (pt measures her knee girth daily)  Discussed quad atrophy, importance of strength for ROM in gait    NMR:  Explanation of each ms group we are exercising or stretching with today's PT gym additions.  Quad setting importance for SLR  Balance, proprioception training with 4-way hip to improved stability, safety with gait  Mini squat form  Encourage 50%/50% WB with standing exercises      Current Measurements (ROM, Strength, Girth, Outcomes, etc.):     OUTCOMES IE    LEFS 29%    PSFS Pain 43%    PSFS Sat 3/10      Range of Motion: (degrees)  Initial Uninvolved  AROM R  Knee  Initial Involved ROM L  07/07/14 L AROM    136  Flexion  75/90 A/PROM  105   0  Extension  4 AROM     (blank fields were intentionally left blank)    Initial   Uninvolved R  LE Strength  MMT /5  Initial  Involved L    5  Hip Flexion  4    4  Hip Extension  4    4+  Hip Abduction  4+    4+  Hip Adduction  4-    5  Quadriceps  4 p!    5  Hamstrings  4    5  Ankle Dorsiflexion  4+    5  Ankle Plantarflexion     (blank fields were intentionally left blank)      Modalities: Electrical Stimulation with Ice: Premod 15 min. Location L M/L knee Position Recumbant and Vasopneumatic Medium 15 min. Location L knee Position Recumbant  Therapy Rationale: Decrease Pain and Decrease Inflammation       Assessment (response to treatment):   Pt was just in last night so she did have some slight, expected ms soreness but she loosened up nicely with  exercise.  Issued more HEP due to pt not coming back in until next week.  Encourage 50%/50% WB with standing exercises like mini squats to prevent compensating with uninvolved leg.  Pt reported her foot will occasionally not DF when ascending stairs - added TR to HEP.  Pt reported SAQ made her knee feel good today.    Progress towards functional goals:  MD pleased with progress and improved L knee flex  Certification Dates: From: 06/29/2014  To: 08/24/14    Goals:   End of Certification    Date  (Body Area, Impairment Goal, Functional   Activity, Target Performance)  Time Frame  Status  Date/  Initial    06/29/2014  Patient will demonstrate independence in prescribed HEP with proper form, sets and reps for safe discharge to an independent program.  8 wks  progress 07/07/14 LG   06/29/2014  Patient will improve LEFS from 29% to 65% for improved function with ADLs.  8 wks      06/29/2014  Patient will improve L knee flexion AROM to 110 deg in order to ascend and descend stairs reciprocally.  8 wks  progress 07/07/14 LG   06/29/2014  Patient will improve L hip abd and knee ext strength to 5/5 in order to walk for 2 blocks without pain.  8 wks         Patient requires continued skilled care to: reduce pain and inflammation, increase strength and ROM and maximize daily function.    Plan:  Ask pt if she is compliant to elevating.  How did she do with new HEP issued today?    as tolerated retest knee special tests  Continue with Plan of Care    Cecelia Byars, LPTA [id number]  07/08/2014

## 2014-07-08 NOTE — PT/OT Exercise Plan (Signed)
Name: Loura Pardon  Referring Physician: Lana Fish, MD  Diagnosis:   Encounter Diagnosis   Name Primary?   . Left knee pain Yes        Precautions:  Pt is taking warfarin  Past Medical History   Diagnosis Date   . Atrial fibrillation    . Hyperlipidemia    . A-fib    . CVA (cerebral vascular accident)    . Hypertension    . Hyperlipemia    . Diabetes mellitus    . Knee joint injury      torn ligaments   . Depression        DOI:    DOS:      MD Follow-up:         Exercise Flow Sheet    Exercise Specifics 07/03/14 07/07/14 07/08/14           C RC  Rocking  Seat 8 LG  5' LG LG           HS stretch   stool LG  B 2X30" LG LG           Wedge stretch  LG  B 2X30" LG LG           4 way hip   GTB on L LE LG  X5ea LG  X10ea LG             Mini squats  LG  X10 LG  X15 LG           LAQ    LG  X10 LG  X15 LG           SAQ    NT LG  X15 LG           Heel slides  LG  X10 LG  Yellow ball LG           Quad sets  LG  5"X10 LG LG                LG  Step up  L lead   Green step  X10 LG                            Home Exercise Program   Heel slides, quad sets, AAROM knee flex, S/L Add, abd, supine SLR review  Mini squats, HR, TR, gastroc/ HS stretch, SAQ           (Initials = supervised exercise by clinician)

## 2014-07-08 NOTE — PT/OT Exercise Plan (Signed)
Name: Lisa Hampton  Referring Physician: Lana Fish, MD  Diagnosis:   Encounter Diagnosis   Name Primary?   . Left knee pain Yes        Precautions:  Pt is taking warfarin  Past Medical History   Diagnosis Date   . Atrial fibrillation    . Hyperlipidemia    . A-fib    . CVA (cerebral vascular accident)    . Hypertension    . Hyperlipemia    . Diabetes mellitus    . Knee joint injury      torn ligaments   . Depression        DOI:    DOS:      MD Follow-up:         Exercise Flow Sheet    Exercise Specifics 07/03/14 07/07/14            C RC  Rocking  Seat 8 LG  5' LG            HS stretch   stool LG  B 2X30" LG            Wedge stretch  LG  B 2X30" LG            4 way hip   GTB on L LE LG  X5ea LG  X10ea              Mini squats  LG  X10 LG  X15            LAQ    LG  X10 LG  X15            SAQ    NT LG  X15            Heel slides  LG  X10 LG  Yellow ball            Quad sets  LG  5"X10 LG                 LG  Step up  L lead   Green step  X10                             Home Exercise Program   Heel slides, quad sets, AAROM knee flex, S/L Add, abd, supine SLR review             (Initials = supervised exercise by clinician)

## 2014-07-13 ENCOUNTER — Inpatient Hospital Stay: Payer: Medicare Other | Admitting: Rehabilitative and Restorative Service Providers"

## 2014-07-13 ENCOUNTER — Ambulatory Visit (INDEPENDENT_AMBULATORY_CARE_PROVIDER_SITE_OTHER): Payer: Medicare Other

## 2014-07-13 DIAGNOSIS — F1099 Alcohol use, unspecified with unspecified alcohol-induced disorder: Secondary | ICD-10-CM

## 2014-07-13 DIAGNOSIS — F1021 Alcohol dependence, in remission: Secondary | ICD-10-CM

## 2014-07-13 DIAGNOSIS — F1994 Other psychoactive substance use, unspecified with psychoactive substance-induced mood disorder: Secondary | ICD-10-CM | POA: Insufficient documentation

## 2014-07-13 DIAGNOSIS — F329 Major depressive disorder, single episode, unspecified: Secondary | ICD-10-CM

## 2014-07-13 DIAGNOSIS — F32A Depression, unspecified: Secondary | ICD-10-CM

## 2014-07-13 DIAGNOSIS — M25562 Pain in left knee: Secondary | ICD-10-CM

## 2014-07-13 MED ORDER — SERTRALINE HCL 50 MG PO TABS
ORAL_TABLET | ORAL | Status: DC
Start: 2014-07-13 — End: 2014-09-09

## 2014-07-13 MED ORDER — BUPROPION HCL ER (XL) 150 MG PO TB24
150.0000 mg | ORAL_TABLET | Freq: Every morning | ORAL | Status: DC
Start: 2014-07-13 — End: 2014-09-09

## 2014-07-13 NOTE — Progress Notes (Addendum)
[facility] Progress Note    Name:          Lisa Hampton    Date:            07/13/2014  DOB:            21-Feb-1956   Age:              59 y.o.  Sex:              female    Appt time:    1000  Fact-to-face time:   25 minutes    CHIEF COMPLAINT  "I feel good."    HISTORY OF PRESENT ILLNESS  Pt is a 59 y.o. year-old with history of depressive disorder and alcohol use d/o in early remission (since NYE May 21, 2014).  Last visit, Paxil was switched to Zoloft and pt continued on Wellbutrin XL 150mg .  Pt was not requiring Ativan, so no refills were given for that.  Today, pt reports:  She started taking sertraline 50mg  s/p 3-4 doses.  Is tolerating without side effects.  Still taking Wellbutrin XL 150mg  daily.  Feels good and affect is bright today.  Is doing PT for knee injuries.  Stated she was told it may take up to 1 year for full recovery.  Denies sleep/appetite/mood disturbance. Denies foggy thinking, trouble w/concentration.  No SI/HI.  Still abstinent from alcohol.   Good response to sertraline.    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 It was upsetting news but she is handling the news well.  Another nurse, recently passed away at age 44-had been very well kept--sharply dressed and traveled quite a bit.  Stated jokingly that at this age gets news about people dying now.             PSYCHIATRIC REVIEW OF SYMPTOMS  Subjective Mood: "good"   Sleep: Reports no difficulty sleeping   Appetite/Weight: Normal/Baseline / No known change   Focus & Concentration: Normal   Energy Level: No complaint/baseline   Delusions:  No evidence of homicidal, suicidal, violent, or delusional thought content expressed or elicited   Hallucinations: No evidence of hallucinosis expressed or elicited    Suicide or  Self-Injury?: No   Homicide or Violence?: No     MEDICAL REVIEW OF SYMPTOMS  Still with blurry vision  No HA, no sleep/appetite issues  No abdominal pain - tolerating Sertraline well.    SUBSTANCE ABUSE HISTORY  Tobacco History   Smoking status   . Never Smoker    Smokeless tobacco   . Not on file      Alcohol Remains abstinent since NYE May 21, 2014   Drugs none       PAST MEDICAL HISTORY  Past Medical History   Diagnosis Date   . Atrial fibrillation    . Hyperlipidemia    . A-fib    . CVA (cerebral vascular accident)    . Hypertension    . Hyperlipemia    . Diabetes mellitus    . Knee joint injury      torn ligaments   . Depression        Past Surgical History   Procedure Laterality Date   . Tonsillectomy     .  Tonsillectomy and adenoidectomy     . Hysterectomy     . Hysterectomy           CURRENT MEDICATIONS  Outpatient Prescriptions Marked as Taking for the 07/13/14 encounter (Office Visit) with Forde Dandy, MD   Medication Sig Dispense Refill   . acetaminophen (TYLENOL) 325 MG tablet Take 2 tablets (650 mg total) by mouth every 6 (six) hours as needed for Pain.     . Ascorbic Acid (VITAMIN C) 1000 MG tablet Take 1,000 mg by mouth daily.     Marland Kitchen aspirin 81 MG chewable tablet Chew 81 mg by mouth daily.     Marland Kitchen buPROPion XL (WELLBUTRIN XL) 150 MG 24 hr tablet Take 1 tablet (150 mg total) by mouth every morning. 30 tablet 2   . Cholecalciferol (VITAMIN D) 1000 UNIT tablet Take 1,000 Units by mouth daily.     Marland Kitchen diltiazem (CARDIZEM CD) 180 MG 24 hr capsule Take 180 mg by mouth daily.     Marland Kitchen lisinopril (PRINIVIL,ZESTRIL) 20 MG tablet Take 20 mg by mouth daily.     . metFORMIN (GLUCOPHAGE-XR) 500 MG 24 hr tablet Take 500 mg by mouth every morning with breakfast.     . Omega-3 Fatty Acids (OMEGA-3 FISH OIL) 500 MG Cap Take by mouth.     . rosuvastatin (CRESTOR) 40 MG tablet Take 40 mg by mouth nightly.     . warfarin (COUMADIN) 3 MG tablet Take 3 mg by mouth daily. Will have INR checked Friday 1/29.  Dose being increased  to get INR to target of 2-3.    Took 5mg  Monday, 6mg  Tuesday, 4mg  tonight, 4mg  tomorrow, and then have checked Friday.       . [DISCONTINUED] sertraline (ZOLOFT) 50 MG tablet Take 1/2 tablet daily x 7 days, then increase to full tablet (50mg ) 30 tablet 1       ALLERGIES  No Known Allergies     MENTAL STATUS EXAMINATION  General Appearance Neatly groomed, appropriately dressed wearing black and white striped top and jeans. Looks adequately nourished.    Behavior Calm, polite, and cooperative-somewhat superficial today   Speech Normal Rate, Rythym, & Volume   Mood Euthymic   Affect Full-Range / Expressive   Thought Process Logical, Linear, Goal Directed  Intact    Thought Content No evidence of hallucinosis expressed or elicited   No evidence of homicidal, suicidal, violent, or delusional thought content expressed or elicited  No Evidence of Suicidal, Homicidal, Violent or Self-Injurious Thoughts or Behaviors Exhibited   Musculoskeletal No weakness, abnormal movements, or other impairments   Gait/Station     No impairments to gait/station; has stiffness in LE, but walking steady unassisted   Cognition A&O x 4, Sensorium Clear  Memory Intact  Attention and concentration Normal  Language:  Fluent with no impairments in comprehension or expression  Fund of knowledge:  Adequate given patient age, socioeconomic status, and educational level     MOCA = 29/30 (recall 5/5, missed the one point no clock hands which she accidentally reversed)   Insight  Good   Judgment No Impairment     DIAGNOSIS  Encounter Diagnoses   Name Primary?   . Substance or medication-induced depressive disorder Yes   . Depressive disorder    . Alcohol use disorder, moderate, in early remission          ASSESSMENT  59 y.o. female with depressive disorder and alcohol used d/o in early remission.  Doing quite well  since  switch from Paxil to sertraline - wants to continue along with wellbutrin xl 150mg  daily.    Presents euthymic and cognitively intact  w/MOCA score of 29/30.  Plans to contact PCP for physical  and to get B12 incorporated into bloodwork.  Is doing well with PT, which she may need to continue  for next 6-12 months.  Still checking INR at home and on Coumadin.        PLAN  -Continue sertraline 50mg  daily and Wellbutrin XL 150mg  daily reordered electronically  [script + 3 refills =4 mo supply]  -Pt to get B12 checked at next medical checkup. Still has the script.  -Brain MRI not indicated at this time.   -f/up 2 months      Treatment options and alternatives reviewed with patient, along with detailed discussion of medication(s)   and side effects, and they concur with aforementioned plan.  Informed consent for medication(s)   and to verbally exchange information with contact obtained.  _____________________________________________  Forde Dandy, MD        07/20/14:  Pt called asking about her diagnoses.  I told her SIMD will be stricken from her record.  It was a diagnosis given to her by a previous provider and carried over.  I have removed it from her chart.

## 2014-07-13 NOTE — PT/OT Exercise Plan (Signed)
Name: Lisa Hampton  Referring Physician: Lana Fish, MD  Diagnosis:   Encounter Diagnosis   Name Primary?   . Left knee pain Yes        Precautions:  Pt is taking warfarin  Past Medical History   Diagnosis Date   . Atrial fibrillation    . Hyperlipidemia    . A-fib    . CVA (cerebral vascular accident)    . Hypertension    . Hyperlipemia    . Diabetes mellitus    . Knee joint injury      torn ligaments   . Depression        DOI:    DOS:      MD Follow-up:         Exercise Flow Sheet    Exercise Specifics 07/03/14 07/07/14 07/08/14 07/13/14          C RC  Rocking  Seat 8 LG  5' LG LG DL, full revs          HS stretch   stool LG  B 2X30" LG LG DL          Wedge stretch  LG  B 2X30" LG LG DL 1'          4 way hip   GTB on L LE LG  X5ea LG  X10ea LG DL            Mini squats  LG  X10 LG  X15 LG DL, leg press 60# A54          LAQ    LG  X10 LG  X15 LG DL, 2#          SAQ    NT LG  X15 LG DL, 2#          Heel slides  LG  X10 LG  Yellow ball LG DL red ball 5" hold          Quad sets  LG  5"X10 LG LG DL, SLR 2#               LG  Step up  L lead   Green step  X10 LG DL, rockerboard s/s f/b taps x10 and hold x30"                           Home Exercise Program   Heel slides, quad sets, AAROM knee flex, S/L Add, abd, supine SLR review  Mini squats, HR, TR, gastroc/ HS stretch, SAQ           (Initials = supervised exercise by clinician)

## 2014-07-13 NOTE — PT/OT Therapy Note (Signed)
DAILY NOTE   07/13/2014     Name: Lisa Hampton Age: 59 y.o.   MRN:  [id number] Referring Physician: Lana Fish, MD DOI: Onset of Problem / Injury: 05/24/14 DOS:   SOC: July 29, 2014   Diagnosis (Treating/Medical):     ICD-10-CM    1. Left knee pain M25.562            Certification Status Ends: Jul 29, 2014 - 09-23-2014   G-Codes required on visit # 10      Time In/Out: 4:08-5:10 pm (pt arrived late)  Total Time:  68' Visit Number:  5    # of Authorized Visits: 16 Visit #: 5    Subjective:  Lisa Hampton reports that she is now able to use the recumbent bike for exercise (6 min). Pt is pleased with her progress in range of motion. Pt is now able to ascend and descend stairs reciprocally.     Objective:   Treatment:  Therapeutic Exercise:Per flowsheet to improve: Flexibility/ROM, Stabilization and Strength   Assisted Warm-up- History taken  Modifications: See flowsheet for specifics. (Verbal, Visual and Tactile cues as noted on flowsheet for proper technique and speed with TE)    Manual Therapy:   Supine, tibiofemoral post glides (gr. 3-4) joint mobilizations, STM to suprapatellar region    Therapeutic Activity: none today    NMR:  Quad setting importance for SLR  Balance, proprioception training with 4-way hip and rockerboard to improved stability, safety with gait        Current Measurements (ROM, Strength, Girth, Outcomes, etc.):     Girth/Edema:    Initial R Initial L L 07/13/14   Mid Patellar 35.5 cm 38 cm 38 pre gameready, 37 post   infrapatellar 34 cm 36.5 cm    suprapatellar 36 cm 39.5 cm      OUTCOMES IE    LEFS 29%    PSFS Pain 43%    PSFS Sat 3/10      Range of Motion: (degrees)  Initial Uninvolved  AROM R  Knee  Initial Involved ROM L  07/07/14 L AROM 07/13/14 L AROM   136  Flexion  75/90 A/PROM  105 115   0  Extension  4 AROM   2    (blank fields were intentionally left blank)    Initial   Uninvolved R  LE Strength  MMT /5  Initial  Involved L    5  Hip Flexion  4    4  Hip Extension  4    4+  Hip Abduction  4+    4+  Hip  Adduction  4-    5  Quadriceps  4 p!    5  Hamstrings  4    5  Ankle Dorsiflexion  4+    5  Ankle Plantarflexion     (blank fields were intentionally left blank)      Modalities: Electrical Stimulation with Ice: Premod 15 min. Location L M/L knee Position Recumbant and Vasopneumatic Medium 15 min. Location L knee Position Recumbant  Therapy Rationale: Decrease Pain and Decrease Inflammation       Assessment (response to treatment):   Lisa Hampton has improve knee flexion and ext AROM significantly and is now able to complete full revolutions of the recumbent bike. Initiated some balance training today which was challenging for the pt. Pt continues to have edema, but responded well to game ready (decreased girth post treatment).     Progress towards functional goals:   Certification Dates: From: 07-29-14  To: 08/24/14    Goals:   End of Certification    Date  (Body Area, Impairment Goal, Functional   Activity, Target Performance)  Time Frame  Status  Date/  Initial    06/29/2014  Patient will demonstrate independence in prescribed HEP with proper form, sets and reps for safe discharge to an independent program.  8 wks  progress 07/07/14 LG   06/29/2014  Patient will improve LEFS from 29% to 65% for improved function with ADLs.  8 wks      06/29/2014  Patient will improve L knee flexion AROM to 110 deg in order to ascend and descend stairs reciprocally.  8 wks  met 07/13/14 DL   05/27/1094  Patient will improve L hip abd and knee ext strength to 5/5 in order to walk for 2 blocks without pain.  8 wks  Progress in function 07/13/14 DL     Patient requires continued skilled care to: reduce pain and inflammation, increase strength and ROM and maximize daily function.    Plan:   Continue with Plan of Care. Assess strength at next visit.     Burlene Montecalvo LeTard, SPT  Jayme Cloud, [place], DPT, [id number]  07/13/2014

## 2014-07-13 NOTE — Patient Instructions (Signed)
[facility] PATIENT INSTRUCTIONS                  1. Follow-up: 2 months  2. For urgent assistance, go to  [facility] in [place], or local Emergency Room, anytime if necessary. [facility] has walk-in hours Monday thru Saturday, Noon to 8pm, excluding major holidays.  We close at 4pm on the first Wednesday of every month.  3. Time-limited, solution-focused psychotherapy programs are available thru [facility] by calling [phone].  4. Abstain from alcohol and/or illegal drugs as they interfere with psychiatric medications   5. Take all medications as prescribed. Do not take extra medications or stop any medications without first talking to and getting instructions to do so from your outpatient providers.   6. Immediately go to the nearest emergency department or call 911 if you have any thoughts of wanting to harm yourself or others, or for any other crisis.  7. Consult with your pharmacist if you questions about your medications, their side effects or possible interactions with other medications you take.    [place]  MENTAL  HEALTH  RESOURCES    [facility] Call Center (For admissions and screening for all [facility] Mental [facility] Services and Addiction Treatment Services CATS, Including PHP Partial Hospitalization Program, Inpatient Detox, IOP Intensive Outpatient Programs, Outpatient psychiatry, Outpatient counseling): [phone]    [facility]  [facility]  [address]  [place], [place] [address] For Urgent Adult (18 and Over) Psychiatric Assessments [phone]     [facility]  [address]  [place], [place] [address]   For Child and Youth (Under 18) Mental Health and Substance Abuse Outpatient Services [phone]   [facility]  [address]  [place], [place] [address]   For Non-Urgent Psychiatric Appointments: [phone]   [facility]  [address] [place], [place] [address]   For Non-Urgent  Psychiatric Appointments: [phone]   [facility] [address]  [address]  [place], [place] [address]   For Non-Urgent Psychiatric Appointments: [phone]   [facility]  [address]   [place], [place] [address]   For Non-Urgent Psychiatric Appointments: [phone]         COMMUNITY   RESOURCES  (MENTAL HEALTH CENTERS):  [facility]  Entry and Referral Services [phone]     [facility], [place], [place] [phone]   [facility], [place], [place] [phone]     [place], [place], [place] [phone]     [facility], [place], [place] [phone]     [facility], [place], [place]  [phone]       [facility], [place], [place] [phone]     [facility], [place], [place]  [phone]       [facility] [address]  [place]) [phone]              [facility] - [phone]

## 2014-07-15 ENCOUNTER — Inpatient Hospital Stay: Payer: Medicare Other

## 2014-07-15 DIAGNOSIS — M25562 Pain in left knee: Secondary | ICD-10-CM

## 2014-07-15 NOTE — PT/OT Therapy Note (Signed)
DAILY NOTE   07/15/2014     Name: Lisa Hampton Age: 59 y.o.   MRN:  [id number] Referring Physician: Lana Fish, MD DOI: Onset of Problem / Injury: 05/24/14 DOS:   SOC: 2014-07-10   Diagnosis (Treating/Medical):     ICD-10-CM    1. Left knee pain M25.562          Certification Status Ends: 2014-07-10 - 09/04/14   G-Codes required on visit # 10      Time In/Out: 11:00 - 11:55  Total Time:  67' Visit Number:  6    # of Authorized Visits: 16 Visit #: 6    Subjective:  Lisa Hampton reports knee can still be tight and warm.  Take it easy going up/down stairs.     Objective:   Treatment:  Therapeutic Exercise:Per flowsheet to improve: Flexibility/ROM, Stabilization and Strength   Assisted Warm-up- History taken  Modifications: See flowsheet for specifics. (Verbal, Visual and Tactile cues as noted on flowsheet for proper technique and speed with TE)    Manual Therapy: (<8', included in TE charge)  Supine, tibiofemoral post glides (gr. 3-4) joint mobilizations, STM to suprapatellar region    Therapeutic Activity: none today    NMR:  Quad setting importance for SLR  Balance, proprioception training with 4-way hip and rockerboard to improved stability, safety with gait        Current Measurements (ROM, Strength, Girth, Outcomes, etc.):     Girth/Edema:    Initial R Initial L L 07/13/14   Mid Patellar 35.5 cm 38 cm 38 pre gameready, 37 post   infrapatellar 34 cm 36.5 cm    suprapatellar 36 cm 39.5 cm      OUTCOMES IE 07/15/14   LEFS 29% 65   PSFS Pain 43%    PSFS Sat 3/10      Range of Motion: (degrees)  Initial Uninvolved  AROM R  Knee  Initial Involved ROM L  07/07/14 L AROM 07/13/14 L AROM   136  Flexion  75/90 A/PROM  105 115   0  Extension  4 AROM   2    (blank fields were intentionally left blank)    Initial   Uninvolved R  LE Strength  MMT /5  Initial  Involved L  07/15/14  L   5  Hip Flexion  4  5   4   Hip Extension  4  4   4+  Hip Abduction  4+  5   4+  Hip Adduction  4-  5   5  Quadriceps  4 p!  4+ p   5  Hamstrings  4  5   5    Ankle Dorsiflexion  4+     5  Ankle Plantarflexion      (blank fields were intentionally left blank)      Modalities: Electrical Stimulation with Ice: Premod 15 min. Location L M/L knee Position Recumbant and Vasopneumatic Medium 15 min. Location L knee Position Recumbant  Therapy Rationale: Decrease Pain and Decrease Inflammation       Assessment (response to treatment):   Pt met goal 2 for improved LEFS today.  LE strength improvements noted today, goal 4.  Continued weakness in hip ex, quads.    Progress towards functional goals:  improved knee flexion and ext AROM significantly  Certification Dates: From: July 10, 2014  To: 04-Sep-2014    Goals:   End of Certification    Date  (Body Area, Impairment Goal, Functional   Activity, Target Performance)  Time Frame  Status  Date/  Initial    06/29/2014  Patient will demonstrate independence in prescribed HEP with proper form, sets and reps for safe discharge to an independent program.  8 wks  progress 07/07/14 LG   06/29/2014  Patient will improve LEFS from 29% to 65% for improved function with ADLs.  8 wks  met 07/15/14   06/29/2014  Patient will improve L knee flexion AROM to 110 deg in order to ascend and descend stairs reciprocally.  8 wks  met 07/13/14 DL   05/27/1094  Patient will improve L hip abd and knee ext strength to 5/5 in order to walk for 2 blocks without pain.  8 wks  Progress in function and strength measures 07/13/14 DL  0/45/40 LG     Patient requires continued skilled care to: reduce pain and inflammation, increase strength and ROM and maximize daily function.    Plan:   Continue with Plan of Care    Cecelia Byars, [place] [address]  07/15/2014

## 2014-07-16 NOTE — PT/OT Exercise Plan (Signed)
Name: Lisa Hampton  Referring Physician: Lana Fish, MD  Diagnosis:   Encounter Diagnosis   Name Primary?   . Left knee pain Yes        Precautions:  Pt is taking warfarin  Past Medical History   Diagnosis Date   . Atrial fibrillation    . Hyperlipidemia    . A-fib    . CVA (cerebral vascular accident)    . Hypertension    . Hyperlipemia    . Diabetes mellitus    . Knee joint injury      torn ligaments   . Depression        DOI:    DOS:      MD Follow-up:         Exercise Flow Sheet    Exercise Specifics 07/03/14 07/07/14 07/08/14 07/13/14 07/15/14         C RC  Rocking  Seat 8 LG  5' LG LG DL, full revs LG         HS stretch   stool LG  B 2X30" LG LG DL LG         Wedge stretch  LG  B 2X30" LG LG DL 1' LG         4 way hip   GTB on L LE LG  X5ea LG  X10ea LG DL LG           Mini squats  LG  X10 LG  X15 LG DL, leg press 95# A21 LG  Seat 3         LAQ    LG  X10 LG  X15 LG DL, 2# LG         SAQ    NT LG  X15 LG DL, 2# LG         Heel slides  LG  X10 LG  Yellow ball LG DL red ball 5" hold LG         Quad sets  LG  5"X10 LG LG DL, SLR 2# LG              LG  Step up  L lead   Green step  X10 LG DL, rockerboard s/s f/b taps x10 and hold x30" LG                          Home Exercise Program   Heel slides, quad sets, AAROM knee flex, S/L Add, abd, supine SLR review  Mini squats, HR, TR, gastroc/ HS stretch, SAQ           (Initials = supervised exercise by clinician)

## 2014-07-20 DIAGNOSIS — F32A Depression, unspecified: Secondary | ICD-10-CM | POA: Insufficient documentation

## 2014-07-20 NOTE — Progress Notes (Signed)
Pt called asking about her diagnoses.  I told her SIMD will be stricken from her record.  It was a diagnosis given to her by a previous provider and carried over.  I have removed it from her chart.

## 2014-07-21 ENCOUNTER — Inpatient Hospital Stay: Payer: Medicare Other | Attending: Internal Medicine

## 2014-07-21 DIAGNOSIS — M25562 Pain in left knee: Secondary | ICD-10-CM | POA: Insufficient documentation

## 2014-07-21 DIAGNOSIS — W108XXD Fall (on) (from) other stairs and steps, subsequent encounter: Secondary | ICD-10-CM | POA: Insufficient documentation

## 2014-07-21 NOTE — PT/OT Exercise Plan (Signed)
Name: Lisa Hampton  Referring Physician: Lana Fish, MD  Diagnosis:   Encounter Diagnosis   Name Primary?   . Left knee pain Yes        Precautions:  Pt is taking warfarin  Past Medical History   Diagnosis Date   . Atrial fibrillation    . Hyperlipidemia    . A-fib    . CVA (cerebral vascular accident)    . Hypertension    . Hyperlipemia    . Diabetes mellitus    . Knee joint injury      torn ligaments   . Depression        DOI:    DOS:      MD Follow-up:         Exercise Flow Sheet    Exercise Specifics 07/03/14 07/07/14 07/08/14 07/13/14 07/15/14 07/21/14        C RC  Rocking  Seat 8 LG  5' LG LG DL, full revs LG LG        HS stretch   stool LG  B 2X30" LG LG DL LG LG        Wedge stretch  LG  B 2X30" LG LG DL 1' LG LG        4 way hip   GTB on L LE LG  X5ea LG  X10ea LG DL LG LG          Mini squats  LG  X10 LG  X15 LG DL, leg press 16# X09 LG  Seat 3 LG        LAQ    LG  X10 LG  X15 LG DL, 2# LG LG  3#        SAQ    NT LG  X15 LG DL, 2# LG LG  3#        Heel slides  LG  X10 LG  Yellow ball LG DL red ball 5" hold LG LG        Quad sets  LG  5"X10 LG LG DL, SLR 2# LG LG             LG  Step up  L lead   Green step  X10 LG DL, rockerboard s/s f/b taps x10 and hold x30" LG LG                         Home Exercise Program   Heel slides, quad sets, AAROM knee flex, S/L Add, abd, supine SLR review  Mini squats, HR, TR, gastroc/ HS stretch, SAQ           (Initials = supervised exercise by clinician)

## 2014-07-21 NOTE — PT/OT Therapy Note (Signed)
DAILY NOTE   07/21/2014     Name: Lisa Hampton Age: 59 y.o.   MRN:  [id number] Referring Physician: Lana Fish, MD DOI: Onset of Problem / Injury: 05/24/14 DOS:   SOC: 07-21-14   Diagnosis (Treating/Medical):     ICD-10-CM    1. Left knee pain M25.562          Certification Status Ends: 07-21-2014 - 09/15/14   G-Codes required on visit # 10      Time In/Out: 6:20 - 7:15  Total Time:  73' Visit Number:  7    # of Authorized Visits: 16 Visit #: 7    Subjective:  Lisa Hampton reports very busy today watching grandchildren so knee is not bending as much, stiff this evening.  Also, did not wear Ace bandage today.      Function:  knee can still be tight and warm.  cautious going up/down stairs.     Objective:   Treatment:  Therapeutic Exercise:Per flowsheet to improve: Flexibility/ROM, Stabilization and Strength   Assisted Warm-up- History taken  Modifications: See flowsheet for specifics. (Verbal, Visual and Tactile cues as noted on flowsheet for proper technique and speed with TE)    Manual Therapy: (<8', included in TE charge)  Supine, tibiofemoral post glides (gr. 3-4) joint mobilizations, STM to suprapatellar region    Therapeutic Activity: none today    NMR:  Quad setting importance for SLR  Balance, proprioception training with 4-way hip and rockerboard to improved stability, safety with gait        Current Measurements (ROM, Strength, Girth, Outcomes, etc.):     Girth/Edema:    Initial R Initial L L 07/13/14   Mid Patellar 35.5 cm 38 cm 38 pre gameready, 37 post   infrapatellar 34 cm 36.5 cm    suprapatellar 36 cm 39.5 cm      OUTCOMES IE 07/15/14   LEFS 29% 65   PSFS Pain 43%    PSFS Sat 3/10      Range of Motion: (degrees)  Initial Uninvolved  AROM R  Knee  Initial Involved ROM L  07/07/14 L AROM 07/13/14 L AROM   136  Flexion  75/90 A/PROM  105 115   0  Extension  4 AROM   2    (blank fields were intentionally left blank)    Initial   Uninvolved R  LE Strength  MMT /5  Initial  Involved L  07/15/14  L   5  Hip Flexion  4   5   4   Hip Extension  4  4   4+  Hip Abduction  4+  5   4+  Hip Adduction  4-  5   5  Quadriceps  4 p!  4+ p   5  Hamstrings  4  5   5   Ankle Dorsiflexion  4+     5  Ankle Plantarflexion      (blank fields were intentionally left blank)      Modalities: Electrical Stimulation with Ice: Premod 15 min. Location L M/L knee Position Recumbant and Vasopneumatic Medium 15 min. Location L knee Position Recumbant  Therapy Rationale: Decrease Pain and Decrease Inflammation       Assessment (response to treatment):   Explained to pt that she could not have handled today previously when knee was more sensitive and that increased activity will increase swelling.  She tolerated exercises well.       Progress towards functional goals:  Improved AROM knee  flexion, ext and LE strength.  Met LEFS goal.    Certification Dates: From: 06/29/2014  To: 08/24/14    Goals:   End of Certification    Date  (Body Area, Impairment Goal, Functional   Activity, Target Performance)  Time Frame  Status  Date/  Initial    06/29/2014  Patient will demonstrate independence in prescribed HEP with proper form, sets and reps for safe discharge to an independent program.  8 wks  progress 07/07/14 LG   06/29/2014  Patient will improve LEFS from 29% to 65% for improved function with ADLs.  8 wks  met 07/15/14   06/29/2014  Patient will improve L knee flexion AROM to 110 deg in order to ascend and descend stairs reciprocally.  8 wks  met 07/13/14 DL   05/27/1094  Patient will improve L hip abd and knee ext strength to 5/5 in order to walk for 2 blocks without pain.  8 wks  Progress in function and strength measures 07/13/14 DL  0/45/40 LG     Patient requires continued skilled care to: reduce pain and inflammation, increase strength (HIP EXT, QUAD) and ROM and maximize daily function.    Plan:   Continue with Plan of Care    Cecelia Byars, [place] [id number]  07/21/2014
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
## 2014-07-24 ENCOUNTER — Inpatient Hospital Stay: Payer: Medicare Other

## 2014-07-24 DIAGNOSIS — M25562 Pain in left knee: Secondary | ICD-10-CM

## 2014-07-24 NOTE — PT/OT Exercise Plan (Signed)
Name: Lisa Hampton  Referring Physician: Lana Fish, MD  Diagnosis:   Encounter Diagnosis   Name Primary?   . Left knee pain Yes        Precautions:  Pt is taking warfarin  Past Medical History   Diagnosis Date   . Atrial fibrillation    . Hyperlipidemia    . A-fib    . CVA (cerebral vascular accident)    . Hypertension    . Hyperlipemia    . Diabetes mellitus    . Knee joint injury      torn ligaments   . Depression        DOI:    DOS:      MD Follow-up:         Exercise Flow Sheet    Exercise Specifics 07/03/14 07/07/14 07/08/14 07/13/14 07/15/14 07/21/14 07/24/14       C RC  Rocking  Seat 8 LG  5' LG LG DL, full revs LG LG LG       HS stretch   stool LG  B 2X30" LG LG DL LG LG LG       Wedge stretch  LG  B 2X30" LG LG DL 1' LG LG LG       4 way hip   GTB on L LE LG  X5ea LG  X10ea LG DL LG LG LG  [id number]         Mini squats  LG  X10 LG  X15 LG DL, leg press 29# B28 LG  Seat 3 LG LG  70#       LAQ    LG  X10 LG  X15 LG DL, 2# LG LG  3# LG         SAQ    NT LG  X15 LG DL, 2# LG LG  3# LG         Heel slides  LG  X10 LG  Yellow ball LG DL red ball 5" hold LG LG LG       Quad sets  LG  5"X10 LG LG DL, SLR 2# LG LG LG            LG  Step up  L lead   Green step  X10 LG DL, rockerboard s/s f/b taps x10 and hold x30" LG LG LG                        Home Exercise Program   Heel slides, quad sets, AAROM knee flex, S/L Add, abd, supine SLR review  Mini squats, HR, TR, gastroc/ HS stretch, SAQ           (Initials = supervised exercise by clinician)

## 2014-07-24 NOTE — PT/OT Therapy Note (Signed)
DAILY NOTE   07/24/2014     Name: KALE DOLS Age: 59 y.o.   MRN:  [id number] Referring Physician: Lana Fish, MD DOI: Onset of Problem / Injury: 05/24/14 DOS:   SOC: 07/12/14   Diagnosis (Treating/Medical):     ICD-10-CM    1. Left knee pain M25.562          Certification Status Ends: 07-12-14 - 2014/09/06   G-Codes required on visit # 10      Time In/Out: 11:00 - 11:55  Total Time:  44' Visit Number:  8    # of Authorized Visits: 16 Visit #: 8    Subjective:  Jakaylah reports surprised she was able to cross legs in waiting room.  Also able to bring leg up into march position.  Pt has increased pain/swelling when active.  Last visit it was watching grandchildren.  Recently it was also standing to cook.    Both knees hurt climbing stairs reciprocally.  Ace bandage helps with swelling and pain if active. (and on stairs)      Function:  knee can still be tight and warm.  cautious going up/down stairs.     Objective:   Treatment:  Therapeutic Exercise:Per flowsheet to improve: Flexibility/ROM, Stabilization and Strength   Assisted Warm-up- History taken  Modifications: See flowsheet for specifics. (Verbal, Visual and Tactile cues as noted on flowsheet for proper technique and speed with TE)    Manual Therapy:  (<8', Included in TE charge)  Supine, tibiofemoral post glides (gr. 3-4) joint mobilizations, STM to suprapatellar region  Retrograde massage (and encouraged at home)    Therapeutic Activity: none today    NMR:  Quad setting importance for SLR  Balance, proprioception training with 4-way hip and rockerboard to improved stability, safety with gait        Current Measurements (ROM, Strength, Girth, Outcomes, etc.):    07/24/14:  L SLS:  23 sec     Girth/Edema:    Initial R Initial L L 07/13/14   Mid Patellar 35.5 cm 38 cm 38 pre gameready, 37 post   infrapatellar 34 cm 36.5 cm    suprapatellar 36 cm 39.5 cm      OUTCOMES IE 07/15/14   LEFS 29% 65   PSFS Pain 43%    PSFS Sat 3/10      Range of Motion: (degrees)  Initial  Uninvolved  AROM R  Knee  Initial Involved ROM L  07/07/14 L AROM 07/13/14 L AROM 07/24/14 L AROM   136  Flexion  75/90 A/PROM  105 115 125   0  Extension  4 AROM   2     (blank fields were intentionally left blank)    Initial   Uninvolved R  LE Strength  MMT /5  Initial  Involved L  07/15/14  L   5  Hip Flexion  4  5   4   Hip Extension  4  4   4+  Hip Abduction  4+  5   4+  Hip Adduction  4-  5   5  Quadriceps  4 p!  4+ p   5  Hamstrings  4  5   5   Ankle Dorsiflexion  4+     5  Ankle Plantarflexion      (blank fields were intentionally left blank)      Modalities: Electrical Stimulation with Ice: Premod 15 min. Location L M/L knee Position Recumbant and Vasopneumatic Medium 15 min. Location L knee  Position Recumbant  Therapy Rationale: Decrease Pain and Decrease Inflammation       Assessment (response to treatment):   Increased AROM L knee flexion and increase L SLS time.       Progress towards functional goals:  See above    Certification Dates: From: 06/29/2014  To: 08/24/14    Goals:   End of Certification    Date  (Body Area, Impairment Goal, Functional   Activity, Target Performance)  Time Frame  Status  Date/  Initial    06/29/2014  Patient will demonstrate independence in prescribed HEP with proper form, sets and reps for safe discharge to an independent program.  8 wks  progress 07/07/14 LG   06/29/2014  Patient will improve LEFS from 29% to 65% for improved function with ADLs.  8 wks  met 07/15/14   06/29/2014  Patient will improve L knee flexion AROM to 110 deg in order to ascend and descend stairs reciprocally.  8 wks  met 07/13/14 DL   05/27/1094  Patient will improve L hip abd and knee ext strength to 5/5 in order to walk for 2 blocks without pain.  8 wks  Progress in function and strength measures 07/13/14 DL  0/45/40 LG     Patient requires continued skilled care to: reduce pain and inflammation, increase strength (HIP EXT, QUAD) and ROM and maximize daily function.    Plan:   Continue with Plan of Care    Cecelia Byars,  [place] [address]  07/24/2014

## 2014-07-28 ENCOUNTER — Inpatient Hospital Stay: Payer: Medicare Other | Admitting: Rehabilitative and Restorative Service Providers"

## 2014-07-28 DIAGNOSIS — M25562 Pain in left knee: Secondary | ICD-10-CM

## 2014-07-28 NOTE — PT/OT Exercise Plan (Signed)
Name: Lisa Hampton  Referring Physician: Lana Fish, MD  Diagnosis:   Encounter Diagnosis   Name Primary?   . Left knee pain Yes        Precautions:  Pt is taking warfarin  Past Medical History   Diagnosis Date   . Atrial fibrillation    . Hyperlipidemia    . A-fib    . CVA (cerebral vascular accident)    . Hypertension    . Hyperlipemia    . Diabetes mellitus    . Knee joint injury      torn ligaments   . Depression        DOI:    DOS:      MD Follow-up:         Exercise Flow Sheet    Exercise Specifics 07/03/14 07/07/14 07/08/14 07/13/14 07/15/14 07/21/14 07/24/14 07/28/14      C RC  Rocking  Seat 8 LG  5' LG LG DL, full revs LG LG LG EM      HS stretch   stool LG  B 2X30" LG LG DL LG LG LG HEP      Wedge stretch  LG  B 2X30" LG LG DL 1' LG LG LG EM      4 way hip   GTB on L LE LG  X5ea LG  X10ea LG DL LG LG LG  [id number] BTB B x10 EM        Mini squats  LG  X10 LG  X15 LG DL, leg press 78# G95 LG  Seat 3 LG LG  70# EM 80#     LAQ    LG  X10 LG  X15 LG DL, 2# LG LG  3# LG   C knee ext seat 5, start 6 10# 2x10 EM      SAQ    NT LG  X15 LG DL, 2# LG LG  3# LG   C HS curls seat seat 4 30# 2x10 EM      Heel slides  LG  X10 LG  Yellow ball LG DL red ball 5" hold LG LG LG Bridge with ball 10x5" EM      Quad sets  LG  5"X10 LG LG DL, SLR 2# LG LG LG -           LG  Step up  L lead   Green step  X10 LG DL, rockerboard s/s f/b taps x10 and hold x30" LG LG LG EM                       Home Exercise Program   Heel slides, quad sets, AAROM knee flex, S/L Add, abd, supine SLR review  Mini squats, HR, TR, gastroc/ HS stretch, SAQ           (Initials = supervised exercise by clinician)

## 2014-07-28 NOTE — PT/OT Therapy Note (Addendum)
DAILY NOTE   07/28/2014   Name: Lisa Hampton Age: 59 y.o.   MRN:  [id number] Referring Physician: Lana Fish, MD DOI: Onset of Problem / Injury: 05/24/14 DOS:   SOC: 07-24-14   Diagnosis (Treating/Medical):     ICD-10-CM    1. Left knee pain M25.562        Certification Status Ends: 2014/07/24 - 18-Sep-2014   G-Codes required on visit # 10    Time In/Out: 6:58-7:59 Total Time:  87' Visit Number:  9    # of Authorized Visits: 16 Visit #: 9    Subjective:  Lisa Hampton reports no issues with walking (able to walk more than a couple blocks). She states difficulty with squatting.      Objective:   Treatment:  Therapeutic Exercise:Per flowsheet to improve: Flexibility/ROM, Stabilization and Strength   Assisted Warm-up- History taken  Modifications: See flowsheet for specifics. (Verbal, Visual and Tactile cues as noted on flowsheet for proper technique and speed with TE)    Manual Therapy:   Supine 90/90: Retrograde massage     Therapeutic Activity: none today    NMR:  Balance, proprioception training with 4-way hip and rockerboard to improved stability, safety with gait    kinesiotape L knee for decreased inflammation (2 fan strips)          Current Measurements (ROM, Strength, Girth, Outcomes, etc.):    07/24/14:  L SLS:  23 sec     Girth/Edema:    Initial R Initial L L 07/13/14   Mid Patellar 35.5 cm 38 cm 38 pre gameready, 37 post   infrapatellar 34 cm 36.5 cm    suprapatellar 36 cm 39.5 cm      OUTCOMES IE 07/15/14   LEFS 29% 65   PSFS Pain 43%    PSFS Sat 3/10      Range of Motion: (degrees)  Initial Uninvolved  AROM R  Knee  Initial Involved ROM L  07/07/14 L AROM 07/13/14 L AROM 07/24/14 L AROM   136  Flexion  75/90 A/PROM  105 115 125   0  Extension  4 AROM   2     (blank fields were intentionally left blank)    Initial   Uninvolved R  LE Strength  MMT /5  Initial  Involved L  07/15/14  L 07/28/14 L   5  Hip Flexion  4  5    4   Hip Extension  4  4    4+  Hip Abduction  4+  5    4+  Hip Adduction  4-  5    5  Quadriceps  4 p!  4+ p 4  p!   5  Hamstrings  4  5    5   Ankle Dorsiflexion  4+      5  Ankle Plantarflexion       (blank fields were intentionally left blank)    Modalities: Electrical Stimulation with Vasopneumatic (medium): Premod 15 min. Location L M/L knee Position Recumbant  Therapy Rationale: Decrease Pain and Decrease Inflammation     Assessment (response to treatment):   Lisa Hampton reported knee pain during C knee ext from approximately 90 to 45 degrees flexion so limited range to 45 to 0 degrees. No pain with exercise once adjusted range. Continued inflammation suprapatellar region.       Progress towards functional goals:    Certification Dates: From: 07/24/14  To: 2014/09/18  Goals:   End of Certification    Date  (  Body Area, Impairment Goal, Functional   Activity, Target Performance)  Time Frame  Status  Date/  Initial    06/29/2014  Patient will demonstrate independence in prescribed HEP with proper form, sets and reps for safe discharge to an independent program.  8 wks  progress 07/07/14 LG   06/29/2014  Patient will improve LEFS from 29% to 65% for improved function with ADLs.  8 wks  met 07/15/14   06/29/2014  Patient will improve L knee flexion AROM to 110 deg in order to ascend and descend stairs reciprocally.  8 wks  met 07/13/14 DL   05/27/1094  Patient will improve L hip abd and knee ext strength to 5/5 in order to walk for 2 blocks without pain.  8 wks  worsened knee ext strength but able to walk 2 blocks without pain 07/28/14 EM     Patient requires continued skilled care to: reduce pain and inflammation, increase strength (HIP EXT, QUAD) and ROM for improved function (per goals above).    Plan:   Continue with Plan of Care, g-codes and progress note    MD follow-up 08/25/13    Jayme Cloud, PT, DPT [phone]  Vestibular Competency Certification 07/28/2014

## 2014-07-31 ENCOUNTER — Inpatient Hospital Stay: Payer: Medicare Other | Admitting: Rehabilitative and Restorative Service Providers"

## 2014-07-31 DIAGNOSIS — M25562 Pain in left knee: Secondary | ICD-10-CM

## 2014-07-31 NOTE — PT/OT Exercise Plan (Signed)
Name: Lisa Hampton  Referring Physician: Lana Fish, MD  Diagnosis:   Encounter Diagnosis   Name Primary?   . Left knee pain Yes        Precautions:  Pt is taking warfarin  Past Medical History   Diagnosis Date   . Atrial fibrillation    . Hyperlipidemia    . A-fib    . CVA (cerebral vascular accident)    . Hypertension    . Hyperlipemia    . Diabetes mellitus    . Knee joint injury      torn ligaments   . Depression        DOI:    DOS:      MD Follow-up:         Exercise Flow Sheet    Exercise Specifics 07/03/14 07/07/14 07/08/14 07/13/14 07/15/14 07/21/14 07/24/14 07/28/14 07/31/14     C RC  Rocking  Seat 8 LG  5' LG LG DL, full revs LG LG LG EM EM     HS stretch   stool LG  B 2X30" LG LG DL LG LG LG HEP -     Wedge stretch  LG  B 2X30" LG LG DL 1' LG LG LG EM EM     4 way hip   GTB on L LE LG  X5ea LG  X10ea LG DL LG LG LG  IO96EX BTB B x10 EM Blue p-band x10 B EM       Mini squats  LG  X10 LG  X15 LG DL, leg press 52# W41 LG  Seat 3 LG LG  70# EM 80# EM     LAQ    LG  X10 LG  X15 LG DL, 2# LG LG  3# LG   C knee ext seat 5, start 6 10# 2x10 EM 20# EM     SAQ    NT LG  X15 LG DL, 2# LG LG  3# LG   C HS curls seat seat 4 30# 2x10 EM EM     Heel slides  LG  X10 LG  Yellow ball LG DL red ball 5" hold LG LG LG Bridge with ball 10x5" EM EM     Quad sets  LG  5"X10 LG LG DL, SLR 2# LG LG LG - Lat step down green x10 EM          LG  Step up  L lead   Green step  X10 LG DL, rockerboard s/s f/b taps x10 and hold x30" LG LG LG EM -                      Home Exercise Program   Heel slides, quad sets, AAROM knee flex, S/L Add, abd, supine SLR review  Mini squats, HR, TR, gastroc/ HS stretch, SAQ           (Initials = supervised exercise by clinician)

## 2014-07-31 NOTE — PT/OT Plan of Care (Signed)
Plan of Care  [facility] Medicare Provider #: [phone]                Patient Name: Lisa Hampton  MRN: [id number]  DOI: Onset of Problem / Injury: 05/24/14 DOS:    SOC: 06/29/2014     Diagnosis:     ICD-10-CM    1. Left knee pain M25.562      HICN Medicare #: Medicare Sub. Num: [id number]  Medicare Sub. Num: [id number]     G Codes: Evaluation / Current: G8978 Mobility CL (60-80) Goal: G8979 Mobility CJ (20-40)  Primary Functional Deficit: Mobility: Walking and Moving Around G-codes determined by: LEFS    ASSESSMENT: the patient is a 59 y.o. female who requires Physical Therapy for the following:  Impairments: decreased L knee and bilat ankle A/PROM, decreased L knee flexion/ext, bilat hip strength, edema, and L knee pain  The patient presents with L knee hematoma secondary to fall on knee.    Functional Limitations: Difficulty getting in and out of car, walking longer than 1 block, caring for grandchildren, pain with ascending and descending stairs (unable to perform reciprocally)    Plan Of Care: NMR, Electrical Stimulation, Instruction in HEP, Ultrasound, Therapeutic Exercise, Balance/Gait training and Soft Tissue ITB, edema management/Joint Mobilization patellar and TF Grade 1-4  Frequency/Duration: 2 times a week for 8 weeks. Anticipated D/C date: 08/24/14    Certification Dates: From: 06/29/2014  To: 08/24/14    Goals:   End of Certification   Date (Body Area, Impairment Goal, Functional   Activity, Target Performance) Time Frame Status Date/  Initial   06/29/2014   Patient will demonstrate independence in prescribed HEP with proper form, sets and reps for safe discharge to an independent program.  8 wks      06/29/2014  Patient will improve LEFS from 29% to 65% for improved function with ADLs.  8 wks     06/29/2014  Patient will improve L knee flexion AROM to 110 deg in order to ascend and descend stairs reciprocally.  8 wks     06/29/2014  Patient will improve L hip abd and knee ext strength to 5/5 in order to walk for 2 blocks  without pain.  8 wks       Signature:   Delanie LeTard, SPT  Jayme Cloud, [place], DPT, [id number] Date: 06/29/2014    Signature: Lana Fish, MD ___________________________ Date:

## 2014-07-31 NOTE — PT/OT Therapy Note (Addendum)
DAILY NOTE   August 27, 2014   Name: Lisa Hampton Age: 59 y.o.   MRN:  [id number] Referring Physician: Lana Fish, MD DOI: Onset of Problem / Injury: 05/24/14 DOS:   SOC: 2014-07-26   Diagnosis (Treating/Medical):     ICD-10-CM    1. Left knee pain M25.562        Certification Status Ends: Jul 26, 2014 - Sep 20, 2014   G-Codes required on visit # 20  08-27-2014: G Codes: Current: V4098 Mobility CJ (20-40), Goal: J1914 Mobility CJ (20-40)   Primary Functional Deficit: Mobility: Walking and Moving Around G-codes determined by: LEFS  Time In/Out: 1:47-2:55 Total Time:  59' Visit Number:  10  # of Authorized Visits: 16 Visit #: 9    Subjective:  Lisa Hampton reports 0.5/10 pain which she has when going up the stairs, has to do 1 stair at a time when carrying things. Stiffness in the morning. No restriction with walking. Able to take an object out from under bed which she couldn't do before.      Objective:   Treatment:  Therapeutic Exercise:Per flowsheet to improve: Flexibility/ROM, Stabilization and Strength   Assisted Warm-up- History taken  Modifications: See flowsheet for specifics. (Verbal, Visual and Tactile cues as noted on flowsheet for proper technique and speed with TE)    Manual Therapy:   Supine 90/90: Retrograde massage     Therapeutic Activity: none today    NMR:  Balance, proprioception training with 4-way hip for improved stability, safety with gait    kinesiotape L knee for decreased inflammation (2 fan strips)          Current Measurements (ROM, Strength, Girth, Outcomes, etc.):    07/24/14:  L SLS:  23 sec     Girth/Edema:    Initial R Initial L L 07/13/14 August 27, 2014 L   Mid Patellar 35.5 cm 38 cm 38 pre gameready, 37 post 37.7   infrapatellar 34 cm 36.5 cm  35 cm   suprapatellar 36 cm 39.5 cm  37.5     OUTCOMES IE 07/15/14   LEFS 29% 65   PSFS Pain 43%    PSFS Sat 3/10      Range of Motion: (degrees)  Initial Uninvolved  AROM R  Knee  Initial Involved ROM L  07/07/14 L AROM 07/13/14 L AROM 07/24/14 L AROM   136  Flexion  75/90  A/PROM  105 115 125   0  Extension  4 AROM   2     (blank fields were intentionally left blank)    Initial   Uninvolved R  LE Strength  MMT /5  Initial  Involved L  07/15/14  L 07/28/14 L   5  Hip Flexion  4  5    4   Hip Extension  4  4    4+  Hip Abduction  4+  5    4+  Hip Adduction  4-  5    5  Quadriceps  4 p!  4+ p 4 p!   5  Hamstrings  4  5    5   Ankle Dorsiflexion  4+      5  Ankle Plantarflexion       (blank fields were intentionally left blank)    Modalities: Electrical Stimulation with Vasopneumatic (medium): Premod 15 min. Location L M/L knee Position Recumbant  Therapy Rationale: Decrease Pain and Decrease Inflammation     Assessment (response to treatment):   G-code goal met however patient needs continued PT to meet below  functional goals. Patient reporting increased sensation in lateral knee today. Significant decrease in inflammation today, kinesiotape beneficial to decrease inflammation.       Progress towards functional goals:    Certification Dates: From: 06/29/2014  To: 08/24/14  Goals:   End of Certification    Date  (Body Area, Impairment Goal, Functional   Activity, Target Performance)  Time Frame  Status  Date/  Initial    06/29/2014  Patient will demonstrate independence in prescribed HEP with proper form, sets and reps for safe discharge to an independent program.  8 wks  progress 07/07/14 LG   06/29/2014  Patient will improve LEFS from 29% to 65% for improved function with ADLs.  8 wks  met 07/15/14   06/29/2014  Patient will improve L knee flexion AROM to 110 deg in order to ascend and descend stairs reciprocally.  8 wks  met 07/13/14 DL   09/23/979  Patient will improve L hip abd and knee ext strength to 5/5 in order to walk for 2 blocks without pain.  8 wks  worsened knee ext strength but able to walk 2 blocks without pain 07/28/14 EM     Patient requires continued skilled care to: reduce pain and inflammation, increase strength (HIP EXT, QUAD) and ROM for improved function (per goals above).    Plan:    Continue with Plan of Care    MD follow-up 08/25/13    Jayme Cloud, PT, DPT [phone]  Vestibular Competency Certification 07/31/2014

## 2014-08-03 ENCOUNTER — Inpatient Hospital Stay: Payer: Medicare Other | Admitting: Rehabilitative and Restorative Service Providers"

## 2014-08-03 DIAGNOSIS — M25562 Pain in left knee: Secondary | ICD-10-CM

## 2014-08-03 NOTE — PT/OT Therapy Note (Addendum)
DAILY NOTE   08/03/2014   Name: Lisa Hampton Age: 59 y.o.   MRN:  [id number] Referring Physician: Lana Fish, MD DOI: Onset of Problem / Injury: 05/24/14 DOS:   SOC: 12-Jul-2014   Diagnosis (Treating/Medical):     ICD-10-CM    1. Left knee pain M25.562        Certification Status Ends: Jul 12, 2014 - 09/06/14   G-Codes required on visit # 20  08-13-2014: G Codes: Current: V4098 Mobility CJ (20-40), Goal: J1914 Mobility CJ (20-40)   Primary Functional Deficit: Mobility: Walking and Moving Around G-codes determined by: LEFS  Time In/Out: 11:03-12:00 Total Time:  9' Visit Number:  11  # of Authorized Visits: 16 Visit #: 11    Subjective:  [name] reports knee was stiff all weekend. Still mild pain with ascending stairs, but this morning only pain with first couple steps and then painfree.     Objective:   Treatment:  Therapeutic Exercise:Per flowsheet to improve: Flexibility/ROM, Stabilization and Strength   Assisted Warm-up- History taken  Modifications: See flowsheet for specifics. (Verbal, Visual and Tactile cues as noted on flowsheet for proper technique and speed with TE)    Manual Therapy:   none    Therapeutic Activity: none today    NMR:  Balance, proprioception training with 4-way hip for improved stability, safety with gait    kinesiotape L knee for decreased inflammation (2 fan strips)          Current Measurements (ROM, Strength, Girth, Outcomes, etc.):    07/24/14:  L SLS:  23 sec     Girth/Edema:    Initial R Initial L L 07/13/14 08-13-14 L   Mid Patellar 35.5 cm 38 cm 38 pre gameready, 37 post 37.7   infrapatellar 34 cm 36.5 cm  35 cm   suprapatellar 36 cm 39.5 cm  37.5     OUTCOMES IE 07/15/14   LEFS 29% 65   PSFS Pain 43%    PSFS Sat 3/10      Range of Motion: (degrees)  Initial Uninvolved  AROM R  Knee  Initial Involved ROM L  07/07/14 L AROM 07/13/14 L AROM 07/24/14 L AROM   136  Flexion  75/90 A/PROM  105 115 125   0  Extension  4 AROM   2     (blank fields were intentionally left blank)    Initial   Uninvolved R   LE Strength  MMT /5  Initial  Involved L  07/15/14  L 07/28/14 L   5  Hip Flexion  4  5    4   Hip Extension  4  4    4+  Hip Abduction  4+  5    4+  Hip Adduction  4-  5    5  Quadriceps  4 p!  4+ p 4 p!   5  Hamstrings  4  5    5   Ankle Dorsiflexion  4+      5  Ankle Plantarflexion       (blank fields were intentionally left blank)    Modalities: Electrical Stimulation with Vasopneumatic (medium): Premod 15 min. Location L M/L knee Position Recumbant  Therapy Rationale: Decrease Pain and Decrease Inflammation     Assessment (response to treatment):   Adelyna requires minimal VCs for exercises in the clinic. Good control with lateral step downs.         Progress towards functional goals:    Certification Dates: From: Jul 12, 2014  To:  08/24/14  Goals:   End of Certification    Date  (Body Area, Impairment Goal, Functional   Activity, Target Performance)  Time Frame  Status  Date/  Initial    06/29/2014  Patient will demonstrate independence in prescribed HEP with proper form, sets and reps for safe discharge to an independent program.  8 wks  progress 07/07/14 LG   06/29/2014  Patient will improve LEFS from 29% to 65% for improved function with ADLs.  8 wks  met 07/15/14   06/29/2014  Patient will improve L knee flexion AROM to 110 deg in order to ascend and descend stairs reciprocally.  8 wks  met 07/13/14 DL   05/27/1094  Patient will improve L hip abd and knee ext strength to 5/5 in order to walk for 2 blocks without pain.  8 wks  worsened knee ext strength but able to walk 2 blocks without pain 07/28/14 EM     Patient requires continued skilled care to: reduce pain and inflammation, increase strength (HIP EXT, QUAD) and ROM for improved function (per goals above).    Plan:   Continue with Plan of Care, D/C next visit, MMT    MD follow-up 08/25/13    Jayme Cloud, PT, DPT [phone]  Vestibular Competency Certification 08/03/2014

## 2014-08-03 NOTE — PT/OT Exercise Plan (Signed)
Name: Lisa Hampton  Referring Physician: Lana Fish, MD  Diagnosis:   Encounter Diagnosis   Name Primary?   . Left knee pain Yes        Precautions:  Pt is taking warfarin  Past Medical History   Diagnosis Date   . Atrial fibrillation    . Hyperlipidemia    . A-fib    . CVA (cerebral vascular accident)    . Hypertension    . Hyperlipemia    . Diabetes mellitus    . Knee joint injury      torn ligaments   . Depression        DOI:    DOS:      MD Follow-up:         Exercise Flow Sheet    Exercise Specifics 07/03/14 07/07/14 07/08/14 07/13/14 07/15/14 07/21/14 07/24/14 07/28/14 07/31/14 08/03/14    C RC  Rocking  Seat 8 LG  5' LG LG DL, full revs LG LG LG EM EM EM    HS stretch   stool LG  B 2X30" LG LG DL LG LG LG HEP - -    Wedge stretch  LG  B 2X30" LG LG DL 1' LG LG LG EM EM EM    4 way hip   GTB on L LE LG  X5ea LG  X10ea LG DL LG LG LG  [id number] BTB B x10 EM Blue p-band x10 B EM EM      Mini squats  LG  X10 LG  X15 LG DL, leg press 65# H84 LG  Seat 3 LG LG  70# EM 80# EM EM    LAQ    LG  X10 LG  X15 LG DL, 2# LG LG  3# LG   C knee ext seat 5, start 6 10# 2x10 EM 20# EM EM    SAQ    NT LG  X15 LG DL, 2# LG LG  3# LG   C HS curls seat seat 4 30# 2x10 EM EM EM    Heel slides  LG  X10 LG  Yellow ball LG DL red ball 5" hold LG LG LG Bridge with ball 10x5" EM EM EM    Quad sets  LG  5"X10 LG LG DL, SLR 2# LG LG LG - Lat step down green x10 EM x20 EM         LG  Step up  L lead   Green step  X10 LG DL, rockerboard s/s f/b taps x10 and hold x30" LG LG LG EM - EM                     Home Exercise Program   Heel slides, quad sets, AAROM knee flex, S/L Add, abd, supine SLR review  Mini squats, HR, TR, gastroc/ HS stretch, SAQ       Lat step down    (Initials = supervised exercise by clinician)

## 2014-08-07 ENCOUNTER — Inpatient Hospital Stay: Payer: Medicare Other | Admitting: Rehabilitative and Restorative Service Providers"

## 2014-08-07 DIAGNOSIS — M25562 Pain in left knee: Secondary | ICD-10-CM

## 2014-08-07 NOTE — PT/OT Exercise Plan (Signed)
Name: Lisa Hampton  Referring Physician: Lana Fish, MD  Diagnosis:   Encounter Diagnosis   Name Primary?   . Left knee pain Yes        Precautions:  Pt is taking warfarin  Past Medical History   Diagnosis Date   . Atrial fibrillation    . Hyperlipidemia    . A-fib    . CVA (cerebral vascular accident)    . Hypertension    . Hyperlipemia    . Diabetes mellitus    . Knee joint injury      torn ligaments   . Depression        DOI:    DOS:      MD Follow-up:         Exercise Flow Sheet    Exercise Specifics 07/03/14 07/07/14 07/08/14 07/13/14 07/15/14 07/21/14 07/24/14 07/28/14 07/31/14 08/03/14 08/07/14   C RC  Rocking  Seat 8 LG  5' LG LG DL, full revs LG LG LG EM EM EM EM   Wedge stretch  LG  B 2X30" LG LG DL 1' LG LG LG EM EM EM EM   4 way hip   GTB on L LE LG  X5ea LG  X10ea LG DL LG LG LG  IR51OA BTB B x10 EM Blue p-band x10 B EM EM EM     Mini squats  LG  X10 LG  X15 LG DL, leg press 41# Y60 LG  Seat 3 LG LG  70# EM 80# EM EM EM   LAQ    LG  X10 LG  X15 LG DL, 2# LG LG  3# LG   C knee ext seat 5, start 6 10# 2x10 EM 20# EM EM EM   SAQ    NT LG  X15 LG DL, 2# LG LG  3# LG   C HS curls seat seat 4 30# 2x10 EM EM EM EM   Heel slides  LG  X10 LG  Yellow ball LG DL red ball 5" hold LG LG LG Bridge with ball 10x5" EM EM EM x20 EM   Quad sets  LG  5"X10 LG LG DL, SLR 2# LG LG LG - Lat step down green x10 EM x20 EM EM        LG  Step up  L lead   Green step  X10 LG DL, rockerboard s/s f/b taps x10 and hold x30" LG LG LG EM - EM EM                    Home Exercise Program   Heel slides, quad sets, AAROM knee flex, S/L Add, abd, supine SLR review  Mini squats, HR, TR, gastroc/ HS stretch, SAQ       Lat step down Wall slide, bridge with ball, 4 way hip   (Initials = supervised exercise by clinician)

## 2014-08-07 NOTE — PT/OT Therapy Note (Addendum)
DAILY NOTE   08/07/2014   Name: Lisa Hampton Age: 59 y.o.   MRN:  [id number] Referring Physician: Lana Fish, MD DOI: Onset of Problem / Injury: 05/24/14 DOS:   SOC: 07-07-2014   Diagnosis (Treating/Medical):     ICD-10-CM    1. Left knee pain M25.562        Certification Status Ends: 07-07-14 - 09/01/2014   G-Codes required on visit # 20  08/08/14: G Codes: D/C: V4098 Mobility CJ (20-40), Goal: J1914 Mobility CJ (20-40)   Primary Functional Deficit: Mobility: Walking and Moving Around G-codes determined by: LEFS  Time In/Out: 11:00-12:02 Total Time:  6' Visit Number:  12  # of Authorized Visits: 16 Visit #: 12    Subjective:  Lisa Hampton reports only mild pain with stairs and with deep squat. She reports no issues with walking now.     Objective:   Treatment:  Therapeutic Exercise:Per flowsheet to improve: Flexibility/ROM, Stabilization and Strength   Assisted Warm-up- History taken  Modifications: See flowsheet for specifics. (Verbal, Visual and Tactile cues as noted on flowsheet for proper technique and speed with TE)    Manual Therapy:   none    Therapeutic Activity: none today    NMR:  Balance, proprioception training with 4-way hip for improved stability, safety with gait    kinesiotape L knee for decreased inflammation (2 fan strips)          Current Measurements (ROM, Strength, Girth, Outcomes, etc.):  08/07/14: patient with new bruising anterior knee   07/24/14:  L SLS:  23 sec     Girth/Edema:    Initial R Initial L L 07/13/14 August 08, 2014 L 08/07/14 L   Mid Patellar 35.5 cm 38 cm 38 pre gameready, 37 post 37.7 37.5  cm   infrapatellar 34 cm 36.5 cm  35 cm 34 cm   suprapatellar 36 cm 39.5 cm  37.5 37.8 cm     OUTCOMES IE 08/07/14   LEFS 29% 76%     Range of Motion: (degrees)  Initial Uninvolved  AROM R  Knee  Initial Involved ROM L  07/24/14 L AROM 08/07/14 L AROM   136  Flexion  75/90 A/PROM  125 118   0  Extension  4 AROM   2   (blank fields were intentionally left blank)    Initial   Uninvolved R  LE Strength  MMT /5   Initial  Involved L  08/07/14 L   5  Hip Flexion  4  5   4   Hip Extension  4  4+ B   4+  Hip Abduction  4+  5   4+  Hip Adduction  4-  5   5  Quadriceps  4 p!  4+ p!   5  Hamstrings  4  5   5   Ankle Dorsiflexion  4+     5  Ankle Plantarflexion      (blank fields were intentionally left blank)    Modalities: Electrical Stimulation with Vasopneumatic Medium: Premod 15 min. Location L M/L knee Position Recumbant  Therapy Rationale: Decrease Pain and Decrease Inflammation     Assessment (response to treatment):   Patient with new bruising anterior knee and instructed pt to contact MD due to patient's hx of hematoma. Patient has met almost all goals and is appropriate to D/C to a HEP.  Progress towards functional goals:    Certification Dates: From: 07-07-14  To: September 01, 2014  Goals:   End of Certification  Date  (Body Area, Impairment Goal, Functional   Activity, Target Performance)  Time Frame  Status  Date/  Initial    06/29/2014  Patient will demonstrate independence in prescribed HEP with proper form, sets and reps for safe discharge to an independent program.  8 wks  met 08/07/14 EM   06/29/2014  Patient will improve LEFS from 29% to 65% for improved function with ADLs.  8 wks  met 07/15/14   06/29/2014  Patient will improve L knee flexion AROM to 110 deg in order to ascend and descend stairs reciprocally.  8 wks  met 07/13/14 DL   6/0/4540  Patient will improve L hip abd and knee ext strength to 5/5 in order to walk for 2 blocks without pain.  8 wks  Progress strength, but able to walk 2 blocks without pain 08/07/14 EM     Patient requires continued skilled care to: reduce pain and inflammation, increase strength (HIP EXT, QUAD) and ROM for improved function (per goals above).    Plan:   D/C from PT  MD follow-up 08/25/13    Jayme Cloud, PT, DPT [phone]  Vestibular Competency Certification 08/07/2014

## 2014-08-10 ENCOUNTER — Ambulatory Visit (INDEPENDENT_AMBULATORY_CARE_PROVIDER_SITE_OTHER): Payer: Self-pay | Admitting: Surgery

## 2014-08-26 ENCOUNTER — Ambulatory Visit (INDEPENDENT_AMBULATORY_CARE_PROVIDER_SITE_OTHER): Payer: Medicare Other | Admitting: Surgery

## 2014-09-07 ENCOUNTER — Ambulatory Visit (INDEPENDENT_AMBULATORY_CARE_PROVIDER_SITE_OTHER): Payer: Medicare Other | Admitting: Surgery

## 2014-09-09 ENCOUNTER — Ambulatory Visit (INDEPENDENT_AMBULATORY_CARE_PROVIDER_SITE_OTHER): Payer: Medicare Other

## 2014-09-09 VITALS — BP 160/91 | HR 80 | Wt 179.0 lb

## 2014-09-09 DIAGNOSIS — F32A Depression, unspecified: Secondary | ICD-10-CM

## 2014-09-09 DIAGNOSIS — F329 Major depressive disorder, single episode, unspecified: Secondary | ICD-10-CM

## 2014-09-09 DIAGNOSIS — F1021 Alcohol dependence, in remission: Secondary | ICD-10-CM

## 2014-09-09 DIAGNOSIS — F1099 Alcohol use, unspecified with unspecified alcohol-induced disorder: Secondary | ICD-10-CM

## 2014-09-09 MED ORDER — BUPROPION HCL ER (XL) 300 MG PO TB24
300.0000 mg | ORAL_TABLET | Freq: Every day | ORAL | Status: DC
Start: 2014-09-09 — End: 2014-12-01

## 2014-09-09 MED ORDER — BUPROPION HCL ER (XL) 300 MG PO TB24
300.0000 mg | ORAL_TABLET | Freq: Every morning | ORAL | Status: DC
Start: 2014-09-09 — End: 2014-09-09

## 2014-09-09 NOTE — Patient Instructions (Addendum)
[facility] PATIENT INSTRUCTIONS                  1. Increase Wellbutrin XL from 150mg  daily to 300mg  daily  2. Follow-up:  6 weeks  3. For urgent assistance, go to  [facility] in [place], or local Emergency Room, anytime if necessary. [facility] has walk-in hours Monday thru Saturday, Noon to 8pm, excluding major holidays.  We close at 4pm on the first Wednesday of every month.  4. Time-limited, solution-focused psychotherapy programs are available thru [facility] by calling [phone].  5. Abstain from alcohol and/or illegal drugs as they interfere with psychiatric medications   6. Take all medications as prescribed. Do not take extra medications or stop any medications without first talking to and getting instructions to do so from your outpatient providers.   7. Immediately go to the nearest emergency department or call 911 if you have any thoughts of wanting to harm yourself or others, or for any other crisis.  8. Consult with your pharmacist if you questions about your medications, their side effects or possible interactions with other medications you take.      "Kindness is the language which the deaf can hear and the blind can see."     -Hebert Soho  MENTAL  HEALTH  RESOURCES    [facility] Call Center (For admissions and screening for all [facility] Mental [facility] Services and Addiction Treatment Services CATS, Including PHP Partial Hospitalization Program, Inpatient Detox, IOP Intensive Outpatient Programs, Outpatient psychiatry, Outpatient counseling): [phone]    [facility]  [facility] Psychiatric Assessment [facility]  [address]  [place], [place] [address] For Urgent Adult (18 and Over) Psychiatric Assessments [phone]     [facility]  [address]  [place], [place] [address]   For Child and Youth (Under 18) Mental Health and Substance Abuse Outpatient Services [phone]   [facility]  [address]  [place], [place] [address]   For Non-Urgent  Psychiatric Appointments: [phone]   [facility]  [address] [place], [place] [address]   For Non-Urgent Psychiatric Appointments: [phone]   [facility]- [address]  [address]  [place], [place] [address]   For Non-Urgent Psychiatric Appointments: [phone]   [facility]  [address]   [place], [place] [address]   For Non-Urgent Psychiatric Appointments: [phone]         COMMUNITY   RESOURCES  (MENTAL HEALTH CENTERS):  [facility]  Entry and Referral Services [phone]     [facility], [place], [place] [phone]   [facility], [place], [place] [phone]     [place], [place], [place] [phone]     [facility], [place], [place] [phone]     [facility], [place], [place]  [phone]       [facility], [place], [place] [phone]     [facility], [place], [place]  [phone]       [facility] [address]  [place]) [phone]              Faythe Dingwall -  [phone]

## 2014-09-09 NOTE — Progress Notes (Addendum)
[facility] Psychiatric Evaluation    Name:          Lisa Hampton    Date:            09/09/2014  DOB:            [birth year]   Age:              59 y.o.  Sex:              female    Appt time:    1100  Face-to-face time:   55 minutes    CHIEF COMPLAINT  "Can you put me on a mood elevator?"    HISTORY OF PRESENT ILLNESS    Pt is a 59 y.o. year-old woman with history of depression here for f/up.  Pt reports she got scammed online on [email or web address] by a man who sweet-talked himself into her life only to end up wanting money from her.  She had actually been contacted by his female friend who stated he had lost his wife and was looking for someone.  She thought she was finally going to meet someone and is bummed it didn't work out but will extend her subscription for another 3 months for a lower rate and see what happens.  She feels down, feels like the Wellbutrin just isn't working for her anymore.  She gets up several times to sleep but otherwise falls back asleep easily.  She has been busy taking care of grandkids and had some trouble with handling them when getting hyper/rowdy.  Plan is still to head down to [place] in about 2 years from now.  Has been up here for 18 months and finds that the time has been flying by fast.  She denies mood swings.  Has no si, hi.  No suspicious thoughts culminating to generalized paranoia but feels unsettled about this man she had met online.  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.  Enjoys going to them.  Wants to feel happier.  15 minutes of session spend providing supportive therapy and exploring enjoyable activities she can do to keep self occupied.  She mentioned she likes going to the bookstore and has an interest in learning [place].  Discussed prioritizing interests by degree of genuine interest.  Recommended 1:1 therapy but pt has had difficulty with inconvenient appt locations/times.  Pt overall doing well.  Leg is  getting better.  Some pain/difficulty when traveling up the stairs.        PSYCHIATRIC REVIEW OF SYMPTOMS  Subjective Mood: down   Sleep: Satisfied with her sleep   Appetite/Weight: No complaints   Focus & Concentration: fair   Energy Level: fair   Delusions:  No evidence of homicidal, suicidal, violent, or delusional thought content expressed or elicited   Hallucinations: No evidence of hallucinosis expressed or elicited    Suicide or Self-Injury?: no   Homicide or Violence?: no     PAST MEDICAL HISTORY    Past Medical History   Diagnosis Date   . Atrial fibrillation    . Hyperlipidemia    . A-fib    . CVA (cerebral vascular accident)    . Hypertension    . Hyperlipemia    . Diabetes mellitus    . Knee joint injury      torn ligaments   . Depression        Past Surgical History   Procedure Laterality Date   .  Tonsillectomy     . Tonsillectomy and adenoidectomy     . Hysterectomy     . Hysterectomy         MEDICAL REVIEW OF SYMPTOMS  No easy bruising or bleeding.  Some pain when going up steps  No CP, SOB    CURRENT MEDICATIONS  Current Outpatient Prescriptions   Medication Sig Dispense Refill   . acetaminophen (TYLENOL) 325 MG tablet Take 2 tablets (650 mg total) by mouth every 6 (six) hours as needed for Pain.     . Ascorbic Acid (VITAMIN C) 1000 MG tablet Take 1,000 mg by mouth daily.     Marland Kitchen aspirin 81 MG chewable tablet Chew 81 mg by mouth daily.     . Cholecalciferol (VITAMIN D) 1000 UNIT tablet Take 1,000 Units by mouth daily.     Marland Kitchen diltiazem (CARDIZEM CD) 180 MG 24 hr capsule Take 180 mg by mouth daily.     Marland Kitchen lisinopril (PRINIVIL,ZESTRIL) 20 MG tablet Take 20 mg by mouth daily.     . metFORMIN (GLUCOPHAGE-XR) 500 MG 24 hr tablet Take 500 mg by mouth every morning with breakfast.     . rosuvastatin (CRESTOR) 40 MG tablet Take 40 mg by mouth nightly.     . warfarin (COUMADIN) 3 MG tablet Take 3 mg by mouth daily. Will have INR checked Friday 1/29.  Dose being increased to get INR to target of 2-3.    Took 5mg   Monday, 6mg  Tuesday, 4mg  tonight, 4mg  tomorrow, and then have checked Friday.       Marland Kitchen buPROPion XL (WELLBUTRIN XL) 300 MG 24 hr tablet Take 1 tablet (300 mg total) by mouth daily. 30 tablet 3   . Omega-3 Fatty Acids (OMEGA-3 FISH OIL) 500 MG Cap Take by mouth.       No current facility-administered medications for this visit.       ALLERGIES  No Known Allergies     VITAL SIGNS  BP 160/91 mmHg  Pulse 80  Wt 81.194 kg (179 lb)    MENTAL STATUS EXAMINATION  General Appearance Neatly groomed, appropriately dressed and adequately nourished   Behavior More engaged, well-related   Speech Normal rate/volume/tone   Mood low   Affect Full-range but is somewhat sad   Thought Process Goal directed, articulating self well   Thought Content No evidence of hallucinosis expressed or elicited   No evidence of homicidal, suicidal, violent, or delusional thought content expressed or elicited  No Evidence of Suicidal, Homicidal, Violent or Self-Injurious Thoughts or Behaviors Exhibited   Musculoskeletal No tremors   Gait/Station     No impairments to gait/station   Cognition A&O x 4, Sensorium Clear  Intact memory  Concentration and memory: fair   Language:  Fluent with no impairments in comprehension or expression  Fund of knowledge:  Adequate given patient age, socioeconomic status, and educational level   Insight  good   Judgment good         DIAGNOSIS  Encounter Diagnoses   Name Primary?   . Depressive disorder Yes   . Alcohol use disorder, moderate, in early remission      ASSESSMENT  59 y.o. female feeling depressed again after initial good response to Wellbutrin XL 150mg .  Recently  got scammed while doing online dating, but is not fully discouraged, and keeping busy with helping  with her granddaughters at home.  Explored pleasurable activities to consider to help with depression.  Will increase Wellbutrin XL to 300mg  daily and  have pt f/up 6 weeks for 60 minute med management  and supportive therapy  appt.    ____________________________________________  Forde Dandy, MD

## 2014-09-11 ENCOUNTER — Ambulatory Visit: Payer: Self-pay

## 2014-09-21 ENCOUNTER — Ambulatory Visit (INDEPENDENT_AMBULATORY_CARE_PROVIDER_SITE_OTHER): Payer: Medicare Other | Admitting: Surgery

## 2014-09-21 ENCOUNTER — Encounter (INDEPENDENT_AMBULATORY_CARE_PROVIDER_SITE_OTHER): Payer: Self-pay | Admitting: Surgery

## 2014-09-21 VITALS — BP 121/84 | HR 81 | Temp 98.0°F | Ht 69.0 in | Wt 182.0 lb

## 2014-09-21 DIAGNOSIS — S8012XD Contusion of left lower leg, subsequent encounter: Secondary | ICD-10-CM

## 2014-09-21 NOTE — Patient Instructions (Signed)
You do not need to follow up with this clinic unless you have further questions or concerns.

## 2014-09-21 NOTE — Progress Notes (Signed)
Subjective:   Lisa Hampton is a 59 y.o. female who is here for had concerns including Follow-up. Patient present for follow up s/p trauma to L knee while on coumadin with subsequent knee joint hematoma formation. Patient reports improving effusion with persistent loss of sensation over lateral knee. Pain with stairs.     Past Medical History   Diagnosis Date   . Atrial fibrillation    . Hyperlipidemia    . A-fib    . CVA (cerebral vascular accident)    . Hypertension    . Hyperlipemia    . Diabetes mellitus    . Knee joint injury      torn ligaments   . Depression      Past Surgical History   Procedure Laterality Date   . Tonsillectomy     . Tonsillectomy and adenoidectomy     . Hysterectomy     . Hysterectomy       Family History   Problem Relation Age of Onset   . Bipolar disorder Sister    . Bipolar disorder Sister    . Hypertension Mother    . Hypertension Father    . Diabetes Father      History     Social History   . Marital Status: Divorced     Spouse Name: N/A   . Number of Children: N/A   . Years of Education: N/A     Occupational History   . Not on file.     Social History Main Topics   . Smoking status: Never Smoker    . Smokeless tobacco: Not on file   . Alcohol Use: No   . Drug Use: No   . Sexual Activity: Not on file     Other Topics Concern   . Not on file     Social History Narrative    ** Merged History Encounter **          Review of patient's allergies indicates no known allergies.  Current Outpatient Prescriptions   Medication Sig Dispense Refill   . acetaminophen (TYLENOL) 325 MG tablet Take 2 tablets (650 mg total) by mouth every 6 (six) hours as needed for Pain.     . Ascorbic Acid (VITAMIN C) 1000 MG tablet Take 1,000 mg by mouth daily.     Marland Kitchen aspirin 81 MG chewable tablet Chew 81 mg by mouth daily.     Marland Kitchen buPROPion XL (WELLBUTRIN XL) 300 MG 24 hr tablet Take 1 tablet (300 mg total) by mouth daily. 30 tablet 3   . Cholecalciferol (VITAMIN D) 1000 UNIT tablet Take 1,000 Units by mouth  daily.     Marland Kitchen diltiazem (CARDIZEM CD) 180 MG 24 hr capsule Take 180 mg by mouth daily.     Marland Kitchen lisinopril (PRINIVIL,ZESTRIL) 20 MG tablet Take 20 mg by mouth daily.     . metFORMIN (GLUCOPHAGE-XR) 500 MG 24 hr tablet Take 500 mg by mouth every morning with breakfast.     . Omega-3 Fatty Acids (OMEGA-3 FISH OIL) 500 MG Cap Take by mouth.     . rosuvastatin (CRESTOR) 40 MG tablet Take 40 mg by mouth nightly.     . warfarin (COUMADIN) 3 MG tablet Take 3 mg by mouth daily. Will have INR checked Friday 1/29.  Dose being increased to get INR to target of 2-3.    Took 5mg  Monday, 6mg  Tuesday, 4mg  tonight, 4mg  tomorrow, and then have checked Friday.  No current facility-administered medications for this visit.       Review of Systems   Constitutional: Negative for fever and chills.   HENT: Negative.    Eyes: Negative.    Respiratory: Negative.    Cardiovascular: Negative.    Gastrointestinal: Negative.    Endocrine: Negative.    Genitourinary: Negative.    Musculoskeletal: Positive for joint swelling and arthralgias. Negative for gait problem.   Skin: Positive for color change. Negative for rash and wound.   Neurological: Positive for numbness. Negative for weakness.   Hematological: Bruises/bleeds easily.     Objective:     Filed Vitals:    09/21/14 1346   BP: 121/84   Pulse: 81   Temp: 98 F (36.7 C)   SpO2: 98%     Physical Exam   Constitutional: She appears well-developed and well-nourished. No distress.   HENT:   Head: Normocephalic and atraumatic.   Eyes: Conjunctivae and EOM are normal.   Musculoskeletal:   L knee ecchymosis and edema improved from prior visit. Decreased sensation over lateral aspect of knee. Full ROM appreciated.   Normal gait   Neurological: She is alert.   Skin: Skin is warm and dry. She is not diaphoretic.   Psychiatric: She has a normal mood and affect.     Assessment:     1. Leg hematoma, left, subsequent encounter     Improving    Previous RAD Imaging:  No results found.  Patient Active  Problem List    Diagnosis Date Noted   . Depressive disorder 07/20/2014   . 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 Alcohol use disorder, moderate, in early remission 07/13/2014   . Leg hematoma, left, initial encounter    . Leg hematoma 05/25/2014   . Warfarin anticoagulation 05/25/2014   . Acute pain due to trauma 05/25/2014   . Peripheral vascular disease 05/25/2014   . Chronic atrial fibrillation 05/25/2014     Plan:   - Patients hematoma continues to improve   - Expect sensation to improve over next few months as nerves regenerate  - Ok to follow up PRN with this clinic    Loetta Rough, DO  [facility] General Surgery PGY1  Pager Number: [phone]    Attending Attestation:     Trauma & Acute Care Surgery Attending:     I have personally seen and examined this patient.  I agree with the clinical information, including the physical exam, patient history, and planning as documented by the Resident.     As above.  Leg healing well.  F/u as needed.    Elenore Paddy, MD  Trauma, Acute Care Surgery  Surgical Critical Care

## 2014-10-16 ENCOUNTER — Ambulatory Visit (INDEPENDENT_AMBULATORY_CARE_PROVIDER_SITE_OTHER): Payer: Medicare Other

## 2014-10-16 VITALS — BP 151/94 | HR 75 | Wt 181.0 lb

## 2014-10-16 DIAGNOSIS — F1021 Alcohol dependence, in remission: Secondary | ICD-10-CM

## 2014-10-16 DIAGNOSIS — F329 Major depressive disorder, single episode, unspecified: Secondary | ICD-10-CM

## 2014-10-16 DIAGNOSIS — F1099 Alcohol use, unspecified with unspecified alcohol-induced disorder: Secondary | ICD-10-CM

## 2014-10-16 DIAGNOSIS — F32A Depression, unspecified: Secondary | ICD-10-CM

## 2014-10-16 NOTE — Progress Notes (Signed)
[facility] Progress Note    Name:          Lisa Hampton    Date:            10/16/2014  DOB:            06/04/1955   Age:              59 y.o.  Sex:              female    Appt time:    1030  Face-to-face time:   25 minutes    CHIEF COMPLAINT  "I feel the same."    HISTORY OF PRESENT ILLNESS  Pt is a 59 y.o. year-old with history of depression and alcohol use disorder in remission presents doing well no complaints but reports mood no different on higher dose of Wellbutrin XL.  Is ok with another month on it to see if any improvement occurs.  Does not c/o feeling very sad.  Is mostly just really busy taking care of the kids.  Has the next two weekends off, not sure what she will do with her time.      Appetite: normal Energy: fair   Concentration: fair    Sleep: wakes up off/on - takes 1/2 of ativan           MEDICAL REVIEW OF SYMPTOMS  Muscle aches and pains  Knee almost gave out twice since last visit  Left knee ecchymosis almost resolved completely      SOCIAL HISTORY  History     Social History Narrative    ** Merged History Encounter **          PAST MEDICAL HISTORY  Past Medical History   Diagnosis Date   . Atrial fibrillation    . Hyperlipidemia    . A-fib    . CVA (cerebral vascular accident)    . Hypertension    . Hyperlipemia    . Diabetes mellitus    . Knee joint injury      torn ligaments   . Depression        Past Surgical History   Procedure Laterality Date   . Tonsillectomy     . Tonsillectomy and adenoidectomy     . Hysterectomy     . Hysterectomy           CURRENT MEDICATIONS  Current Outpatient Prescriptions   Medication Sig Dispense Refill   . acetaminophen (TYLENOL) 325 MG tablet Take 2 tablets (650 mg total) by mouth every 6 (six) hours as needed for Pain.     . Ascorbic Acid (VITAMIN C) 1000 MG tablet Take 1,000 mg by mouth daily.     Marland Kitchen aspirin 81 MG chewable tablet Chew 81 mg by mouth daily.     Marland Kitchen buPROPion XL (WELLBUTRIN XL) 300 MG 24 hr tablet Take 1 tablet (300 mg  total) by mouth daily. 30 tablet 3   . Cholecalciferol (VITAMIN D) 1000 UNIT tablet Take 1,000 Units by mouth daily.     Marland Kitchen diltiazem (CARDIZEM CD) 180 MG 24 hr capsule Take 180 mg by mouth daily.     Marland Kitchen lisinopril (PRINIVIL,ZESTRIL) 20 MG tablet Take 20 mg by mouth daily.     . metFORMIN (GLUCOPHAGE-XR) 500 MG 24 hr tablet Take 500 mg by mouth every morning with breakfast.     . Omega-3 Fatty Acids (OMEGA-3 FISH OIL) 500 MG Cap Take by mouth.     Marland Kitchen  rosuvastatin (CRESTOR) 40 MG tablet Take 40 mg by mouth nightly.     . warfarin (COUMADIN) 3 MG tablet Take 3 mg by mouth daily. Will have INR checked Friday 1/29.  Dose being increased to get INR to target of 2-3.    Took 5mg  Monday, 6mg  Tuesday, 4mg  tonight, 4mg  tomorrow, and then have checked Friday.         No current facility-administered medications for this visit.       ALLERGIES  No Known Allergies     VITAL SIGNS  BP 151/94 mmHg  Pulse 75  Wt 82.101 kg (181 lb)    MENTAL STATUS EXAMINATION  General Appearance Well groomed, wearing hoop earrings, black linen summer dress hitting just below the knees   Behavior Calm, polite, and cooperative   Speech Normal rate/volume/tone   Mood ok   Affect restricted   Thought Process Goal directed   Thought Content Appropriate  No suicidal or homicidal ideations  No evidence for manic or psychotic sxs   Musculoskeletal Able to move all LIMBS freely despite aches and pains   Gait/Station     normal   Cognition Aaox3, good attention and concentration during visit   Insight  good   Judgment good     DIAGNOSIS  Encounter Diagnoses   Name Primary?   . Depressive disorder    . Alcohol use disorder, moderate, in early remission Yes         ASSESSMENT AND PLAN  59 y.o. female not noticing much of a change yet on higher dose of wellbutrin xl @ 300mg , but does  Not present to me dysphoric.  Is pleasant, well groomed, only complaint is disturbed sleep from  time to time that 1/2 of ativan 0.5mg  seems to help.  Was considering adderall to  augment but  bp reading elevated today so will hold off and keep this in mind to start next visit, if still reporting  no change.  F/up 4 weeks  _____________________________________________  Forde Dandy, MD

## 2014-10-16 NOTE — Patient Instructions (Addendum)
[facility] PATIENT INSTRUCTIONS                  1. Follow-up:  4 weeks  2. For urgent assistance, go to  [facility] in [place], or local Emergency Room, anytime if necessary. [facility] has walk-in hours Monday thru Saturday, Noon to 8pm, excluding major holidays.  We close at 4pm on the first Wednesday of every month.  3. Time-limited, solution-focused psychotherapy programs are available thru [facility] by calling [phone].  4. Abstain from alcohol and/or illegal drugs as they interfere with psychiatric medications   5. Take all medications as prescribed. Do not take extra medications or stop any medications without first talking to and getting instructions to do so from your outpatient providers.   6. Immediately go to the nearest emergency department or call 911 if you have any thoughts of wanting to harm yourself or others, or for any other crisis.  7. Consult with your pharmacist if you questions about your medications, their side effects or possible interactions with other medications you take.      "Kindness is the language which the deaf can hear and the blind can see."     -Hebert Soho  MENTAL  HEALTH  RESOURCES    [facility] Call Center (For admissions and screening for all [facility] Mental Health Services and Addiction Treatment Services CATS, Including PHP Partial Hospitalization Program, Inpatient Detox, IOP Intensive Outpatient Programs, Outpatient psychiatry, Outpatient counseling): [phone]    [facility]  [facility] Psychiatric Assessment Center  [address]  [place], [place] [address] For Urgent Adult (18 and Over) Psychiatric Assessments [phone]     [facility]  [address]  [place], [place] [address]   For Child and Youth (Under 18) Mental Health and Substance Abuse Outpatient Services [phone]   [facility]  [address]  [place], [place] [address]   For Non-Urgent Psychiatric Appointments: [phone]   [facility]  [address] [place], [place] [address]   For Non-Urgent Psychiatric Appointments: [phone]   [facility] [address]  [address]  [place], [place] [address]   For Non-Urgent Psychiatric Appointments: [phone]   [facility]  [address]   [place], [place] [address]   For Non-Urgent Psychiatric Appointments: [phone]         COMMUNITY   RESOURCES  (MENTAL HEALTH CENTERS):  [facility]  Entry and Referral Services [phone]     [facility], [place], [place] [phone]   [facility], [place], [place] [phone]     [facility], [place], [place] [phone]     [facility], [place], [place] [phone]     [facility], [place], [place]  [phone]       [facility], [place], [place] [phone]     [facility], [place], [place]  [phone]       [facility] [address]  [place]) [phone]              [facility] D[facility] - [phone]

## 2014-11-09 ENCOUNTER — Ambulatory Visit: Payer: Medicare Other

## 2014-11-16 ENCOUNTER — Encounter: Payer: Self-pay | Admitting: Psychiatric/Mental Health

## 2014-11-16 ENCOUNTER — Ambulatory Visit (INDEPENDENT_AMBULATORY_CARE_PROVIDER_SITE_OTHER): Payer: Medicare Other | Admitting: Psychiatric/Mental Health

## 2014-11-16 ENCOUNTER — Ambulatory Visit: Payer: Medicare Other

## 2014-11-16 VITALS — BP 138/91 | HR 76 | Wt 187.0 lb

## 2014-11-16 DIAGNOSIS — F32A Depression, unspecified: Secondary | ICD-10-CM

## 2014-11-16 DIAGNOSIS — F329 Major depressive disorder, single episode, unspecified: Secondary | ICD-10-CM

## 2014-11-16 MED ORDER — TRAZODONE HCL 50 MG PO TABS
25.0000 mg | ORAL_TABLET | Freq: Every evening | ORAL | Status: DC
Start: 2014-11-16 — End: 2014-12-01

## 2014-11-16 NOTE — Patient Instructions (Signed)
Trazodone Hydrochloride Oral tablet  What is this medicine?  TRAZODONE (TRAZ oh done) is used to treat depression.  This medicine may be used for other purposes; ask your health care provider or pharmacist if you have questions.  What should I tell my health care provider before I take this medicine?  They need to know if you have any of these conditions:   attempted suicide or thinking about it   bipolar disorder   bleeding problems   glaucoma   heart disease, or previous heart attack   irregular heart beat   kidney or liver disease   low levels of sodium in the blood   an unusual or allergic reaction to trazodone, other medicines, foods, dyes or preservatives   pregnant or trying to get pregnant   breast-feeding  How should I use this medicine?  Take this medicine by mouth with a glass of water. Follow the directions on the prescription label. Take this medicine shortly after a meal or a light snack. Take your medicine at regular intervals. Do not take your medicine more often than directed. Do not stop taking this medicine suddenly except upon the advice of your doctor. Stopping this medicine too quickly may cause serious side effects or your condition may worsen.  A special MedGuide will be given to you by the pharmacist with each prescription and refill. Be sure to read this information carefully each time.  Talk to your pediatrician regarding the use of this medicine in children. Special care may be needed.  Overdosage: If you think you have taken too much of this medicine contact a poison control center or emergency room at once.  NOTE: This medicine is only for you. Do not share this medicine with others.  What if I miss a dose?  If you miss a dose, take it as soon as you can. If it is almost time for your next dose, take only that dose. Do not take double or extra doses.  What may interact with this medicine?  Do not take this medicine with any of the following  medications:   cisapride   dofetilide   dronedarone   fluconazole   linezolid   MAOIs like Carbex, Eldepryl, Marplan, Nardil, and Parnate   mesoridazine   methylene blue (injected into a vein)   pimozide   posaconazole   saquinavir   thioridazine   ziprasidone  This medicine may also interact with the following medications:   alcohol   antiviral medicines for HIV or AIDS   aspirin and aspirin-like medicines   barbiturates such as phenobarbital   certain medicines that treat or prevent blood clots like warfarin, enoxaparin, and dalteparin   certain medicines for blood pressure, heart disease, irregular heart beat   certain medicines for depression, anxiety, or psychotic disturbances   certain medicines for migraine headache like almotriptan, eletriptan, frovatriptan, naratriptan, rizatriptan, sumatriptan, zolmitriptan   certain medicines for sleep   digoxin   fentanyl   ketoconazole   lithium   medicines for seizures like carbamazepine and phenytoin   NSAIDS, medicines for pain and inflammation, like ibuprofen or naproxen   rasagiline   supplements like St. John's wort, kava kava, valerian   tramadol   tryptophan  This list may not describe all possible interactions. Give your health care provider a list of all the medicines, herbs, non-prescription drugs, or dietary supplements you use. Also tell them if you smoke, drink alcohol, or use illegal drugs. Some items may interact with your   medicine.  What should I watch for while using this medicine?  Tell your doctor if your symptoms do not get better or if they get worse. Visit your doctor or health care professional for regular checks on your progress. Because it may take several weeks to see the full effects of this medicine, it is important to continue your treatment as prescribed by your doctor.  Patients and their families should watch out for new or worsening thoughts of suicide or depression. Also watch out for sudden changes in  feelings such as feeling anxious, agitated, panicky, irritable, hostile, aggressive, impulsive, severely restless, overly excited and hyperactive, or not being able to sleep. If this happens, especially at the beginning of treatment or after a change in dose, call your health care professional.  Bonita Quin may get drowsy or dizzy. Do not drive, use machinery, or do anything that needs mental alertness until you know how this medicine affects you. Do not stand or sit up quickly, especially if you are an older patient. This reduces the risk of dizzy or fainting spells. Alcohol may interfere with the effect of this medicine. Avoid alcoholic drinks.  This medicine may cause dry eyes and blurred vision. If you wear contact lenses you may feel some discomfort. Lubricating drops may help. See your eye doctor if the problem does not go away or is severe.  Your mouth may get dry. Chewing sugarless gum, sucking hard candy and drinking plenty of water may help. Contact your doctor if the problem does not go away or is severe.  What side effects may I notice from receiving this medicine?  Side effects that you should report to your doctor or health care professional as soon as possible:   allergic reactions like skin rash, itching or hives, swelling of the face, lips, or tongue   fast, irregular heartbeat   feeling faint or lightheaded, falls   painful erections or other sexual dysfunction   suicidal thoughts or other mood changes   trembling  Side effects that usually do not require medical attention (report to your doctor or health care professional if they continue or are bothersome):   constipation   headache   muscle aches or pains   nausea, vomiting   unusually weak or tired  This list may not describe all possible side effects. Call your doctor for medical advice about side effects. You may report side effects to FDA at 1-800-FDA-1088.  Where should I keep my medicine?  Keep out of the reach of children.  Store at  room temperature between 15 and 30 degrees C (59 to 86 degrees F). Protect from light. Keep container tightly closed. Throw away any unused medicine after the expiration date.  NOTE:This sheet is a summary. It may not cover all possible information. If you have questions about this medicine, talk to your doctor, pharmacist, or health care provider. Copyright 2015 Gold Standard    Start trazodone 25 mg daily at bedtime  Continue wellbutrin 300 mg daily in am  Return in 2 weeks

## 2014-11-16 NOTE — Progress Notes (Signed)
[facility] Psychiatric Follow-Up    Date/Time:   11/16/2014 / 2:03 PM  Name:  Lisa Hampton, Lisa Hampton  MRN:    [id number]  Age:   59 y.o.  DOB:   03-27-1956  Sex:  female      CHIEF COMPLAINT  "I am feeling better."      HISTORY OF PRESENT ILLNES    Follow up with  59 y.o. year-old woman with history of depression.  She has seen Dr. Angelyn Punt in the past.  Current medication is wellbutrin 300 mg daily in am.   She is a retired Engineer, civil (consulting) and enjoys being busy.  She has been on 300 mg of wellbutrin for over 6 weeks and feels relief.    She complains that her whole life she has had difficulty falling asleep.  She ruminates as she waits to sleep.  She is one of 6 sisters and 2 of her sisters have bipolar disease.  Strongly denies SI/HI thoughts or plans.  She reports that she has feelings of depression over 1/2 of her days.  She also complains about being tired every day. She denies mood swings.She has some interest in going to therapy to talk about the losses that she has had in her life.                  Current/Home Medications    ACETAMINOPHEN (TYLENOL) 325 MG TABLET    Take 2 tablets (650 mg total) by mouth every 6 (six) hours as needed for Pain.    ASCORBIC ACID (VITAMIN C) 1000 MG TABLET    Take 1,000 mg by mouth daily.    ASPIRIN 81 MG CHEWABLE TABLET    Chew 81 mg by mouth daily.    BUPROPION XL (WELLBUTRIN XL) 300 MG 24 HR TABLET    Take 1 tablet (300 mg total) by mouth daily.    CHOLECALCIFEROL (VITAMIN D) 1000 UNIT TABLET    Take 1,000 Units by mouth daily.    DILTIAZEM (CARDIZEM CD) 180 MG 24 HR CAPSULE    Take 180 mg by mouth daily.    LISINOPRIL (PRINIVIL,ZESTRIL) 20 MG TABLET    Take 20 mg by mouth daily.    METFORMIN (GLUCOPHAGE-XR) 500 MG 24 HR TABLET    Take 500 mg by mouth every morning with breakfast.    OMEGA-3 FATTY ACIDS (OMEGA-3 FISH OIL) 500 MG CAP    Take by mouth.    ROSUVASTATIN (CRESTOR) 40 MG TABLET    Take 40 mg by mouth nightly.    WARFARIN (COUMADIN) 3 MG TABLET    Take 3 mg by mouth daily.  Will have INR checked Friday 1/29.  Dose being increased to get INR to target of 2-3.    Took 5mg  Monday, 6mg  Tuesday, 4mg  tonight, 4mg  tomorrow, and then have checked Friday.             REVIEW OF SYSTEMS   Constitutional: denies fever, malaise  Cardiovascular: atrial fibrillation, hypertension  Endocrine: diabetes type II      PSYCHIATRIC SPECIALITY & MENTAL STATUS EXAM  Vital Signs BP 138/91 mmHg  Pulse 76  Wt 84.823 kg (187 lb)   General Appearance Neatly groomed, appropriately dressed and adequately nourished   Muskuloskeletal No weakness, abnormal movements, or other impairments   Speech Normal Rate, Rythym, & Volume   Thought Process Logical, Linear, Goal Directed   Associations Intact    Thought Content No evidence of homicidal, suicidal, violent, or delusional thought content   Perceptions  No evidence of hallucinosis   Judgment No Impairment   Insight  Good   LOC/Orientation A&O x 4, Sensorium Clear   Memory Intact   Attention & Concentration Normal   Mood Depressed   Affect Full-Range / Expressive       ASSESSMENT  59 y.o. female presents pleasant and engaged.  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  She is forward looking and planning to return to [place] in less than 2 years.  PHQ-9 score 12/27, GAD-7 score 8/21.  She has 2 sisters with bipolar disease.  Denies psychotic symptoms. She talks about friends dying, family dysfunction and that she would like to talk with a therapist.    Encounter Diagnosis   Name Primary?   . Depressive disorder Yes   Axis II: deferred  Axis III: atrial fib, post CVA, diabetes, hypertension      PLAN  Treatment options and alternatives reviewed with patient, along with detailed discussion of medication(trazodone, wellbutrin) and side effects, and they concur with following plan: Low dose trazodone started to help with sleep to improve feelings of tiredness in the day and her tendency to worry.  Gave referral totherapist in  this office who takes Medicare.    Medications:  Start trazodone 25 mg daily at bedtime  Continue wellbutrin 300 mg daily in am      Therapies:   Psychotherapy: Pt to arrange thru their insurance or external referral   Partial Hospital Program: PHP not Relevant at this Time        DISPOSITION & FOLLOW-UP   Discharge to: self   Follow-up:2 weeks   Return to [facility]    _____________________________________________  Glenis Smoker, NP

## 2014-11-30 ENCOUNTER — Ambulatory Visit (INDEPENDENT_AMBULATORY_CARE_PROVIDER_SITE_OTHER): Payer: Medicare Other

## 2014-11-30 DIAGNOSIS — F1021 Alcohol dependence, in remission: Secondary | ICD-10-CM

## 2014-11-30 DIAGNOSIS — F1994 Other psychoactive substance use, unspecified with psychoactive substance-induced mood disorder: Secondary | ICD-10-CM

## 2014-11-30 NOTE — Progress Notes (Signed)
Individual Therapy Session Note - Initial      Date:  11-30-2014    .  Time in: 1 p.m.         Time out: 1:50 p.m.    Duration of Session:  50 minutes    Current Focus:  Relationships with family    Patient Stated Goals:  "deal with" relationship with father       Intervention: Cognitive/behavioral therapy; reframing    Data: Alyla is a 59 year old divorced mother of two daughters who worked as a Engineer, civil (consulting) until a few years ago.  Her oldest daughter lives in the area with her husband and children and provides a home for Beverely Low; the other daughter is homeless and living in a shelter in [place] with her boyfriend.  She is supportive of both of her daughters.  While her daughter Vance Gather, age 28, and her husband work, Market researcher cares for [employer] three children ages 45, 25 and 33.  She previously cared for daughter Michelle's child, until she was age 47,  but the child, now age 64, lives with her grandfather ([place]'s former husband) and his wife.  The child is described as somewhat "hyper" but otherwise well cared for and functioning well.  Daughter Marcelino Duster, age 66, formerly used illegal drugs but now is addicted to ETOH and is unable to establish sobriety.  Her companion is an alcoholic female with children who live elsewhere.  Kyrene has lived in [place], [place] and now [place] as she has raised her own daughters and cared for her grandchildren and mother over the years.  She has four sisters who maintain conflict with each other over the care of their mother, who now has Alzheimer's disease and lives in an assisted living facility in [place].  Her father lives with his second wife in [place].  He left Amariana and her siblings and mother when Rosy was 59 years old. Aleathea attempts to see or talk with him occasionally but he has little interest in her or her siblings.  Tigerlily plans to move to [place] herself when she has earned in about two years from caring for her grandchildren.  Her daughter is  handling her money and says she will buy her mom a house in [place] near [place] friends.  Vlasta has been receiving social security disability payments for some 3 years as a result of heart issues.  In the past year two of Samika's female relatives have died suddenly.  Her brother died in an auto accident many years ago.       Behavioral Observations: Kiyla presents as neatly dressed and appeared to be her stated age.  She maintained eye contact throughout the session.  She has a slight lisp in her speech but otherwise is articulate and thoughtful.           Assessment/response to intervention/skills and/or knowledge gained:  Yaritzy recognizes that she has experienced serious losses in the last year but wants now to focus her therapy on her relationship with her father.                  Plan:  Kween will maintain her medication regimen and will meet regularly with Ms. Kellie Shropshire for medication review.  She will return for individual therapy as scheduled in one week.              Safety Planning:  PHQ-9 score:  1  Sleep problem  [x ] Assessed for suicide or self-harm  [ x] Assessed for alcohol or  drug use or abuse     [ ]  Patient is at moderate risk for suicide but not in need of immediate inpatient treatment.      Patient agrees to safety plan including calling 911 and/or going to ER.     Durene Romans. Elizaveta Mattice, LCSW

## 2014-12-01 ENCOUNTER — Ambulatory Visit (INDEPENDENT_AMBULATORY_CARE_PROVIDER_SITE_OTHER): Payer: Medicare Other | Admitting: Psychiatric/Mental Health

## 2014-12-01 ENCOUNTER — Encounter: Payer: Self-pay | Admitting: Psychiatric/Mental Health

## 2014-12-01 VITALS — BP 136/88 | HR 76 | Wt 186.0 lb

## 2014-12-01 DIAGNOSIS — F32A Depression, unspecified: Secondary | ICD-10-CM

## 2014-12-01 DIAGNOSIS — F329 Major depressive disorder, single episode, unspecified: Secondary | ICD-10-CM

## 2014-12-01 MED ORDER — BUPROPION HCL ER (XL) 300 MG PO TB24
300.0000 mg | ORAL_TABLET | Freq: Every day | ORAL | Status: DC
Start: 2014-12-01 — End: 2015-02-07

## 2014-12-01 MED ORDER — TRAZODONE HCL 50 MG PO TABS
25.0000 mg | ORAL_TABLET | Freq: Every evening | ORAL | Status: AC
Start: 2014-12-01 — End: 2014-12-31

## 2014-12-01 NOTE — Progress Notes (Signed)
Psychiatry Outpatient Follow-Up Note    12/01/2014    [name], a 59 y.o. female, presented for 2 week  follow-up appointment.       Interval History:     SUBJECTIVE:  2 week appointment to follow on recent prescription of trazodone 25 mg nightly for sleep.  Pt reports that she is sleeping well and feels markedly improved. She continues with Wellbutrin XL 300 mg daily.  She denies anhedonia, poor appetite, trouble concentrating and feeling bad about herself.  She has started seeing therapist in office and feels relief talking with someone.  She continues to deny mood swings.      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     Current Outpatient Prescriptions on File Prior to Visit   Medication Sig Dispense Refill   . acetaminophen (TYLENOL) 325 MG tablet Take 2 tablets (650 mg total) by mouth every 6 (six) hours as needed for Pain.     . Ascorbic Acid (VITAMIN C) 1000 MG tablet Take 1,000 mg by mouth daily.     Marland Kitchen aspirin 81 MG chewable tablet Chew 81 mg by mouth daily.     . Cholecalciferol (VITAMIN D) 1000 UNIT tablet Take 1,000 Units by mouth daily.     Marland Kitchen diltiazem (CARDIZEM CD) 180 MG 24 hr capsule Take 180 mg by mouth daily.     Marland Kitchen lisinopril (PRINIVIL,ZESTRIL) 20 MG tablet Take 20 mg by mouth daily.     . metFORMIN (GLUCOPHAGE-XR) 500 MG 24 hr tablet Take 500 mg by mouth every morning with breakfast.     . Omega-3 Fatty Acids (OMEGA-3 FISH OIL)  500 MG Cap Take by mouth.     . rosuvastatin (CRESTOR) 40 MG tablet Take 40 mg by mouth nightly.     . warfarin (COUMADIN) 3 MG tablet Take 3 mg by mouth daily. Will have INR checked Friday 1/29.  Dose being increased to get INR to target of 2-3.    Took 5mg  Monday, 6mg  Tuesday, 4mg  tonight, 4mg  tomorrow, and then have checked Friday.       . [DISCONTINUED] buPROPion XL (WELLBUTRIN XL) 300 MG 24 hr tablet Take 1 tablet (300 mg total) by mouth daily. 30 tablet 3   . [DISCONTINUED] traZODone (DESYREL) 50 MG tablet Take 0.5 tablets (25 mg total) by mouth nightly. 30 tablet 0     No current facility-administered medications on file prior to visit.       Medication Consent form signed Yes. Risks/benefits/alternatives/side effects of medications and treatment modalities discussed and patient  informed verbal consent and has signed medication consent form.     Review Of Systems:     A complete review of systems was negative except for psychiatric, as noted in HPI.    Physical Exam:     GENERAL: no apparent distress  NERVOUS: CN2-12 grossly intact  MUSCULOSKELTAL: Normal   GAIT: normal gait  Mental Status Evaluation:     APPEARANCE: Well groomed, casually dressed, appears stated age.    BEHAVIOR: Cooperative, good eye contact.       SPEECH AND LANGUAGE:  Regular rate, rhythm, volume and tone; linear fluent and goal directed; appropriate.  MOOD: euthymic  AFFECT: congruent; pleasant  THOUGHT PROCESSES: Logical, goal directed  ASSOCIATIONS:  Appear intact  THOUGHT CONTENT: No hallucinations or overt delusions; no obsessions  SAFETY:   No suicidal/homicidal/self injury ideation/intent/plan and pt is able to contract for safety   JUDGEMENT and INSIGHT:  Appear intact      ORIENTATION: fully oriented to person, place, time  MEMORY:  grossly intact    ATTENTION AND CONCENTRATION: normal /able to participate in this exam  FUND OF KNOWLEDGE: age/station appropriate  Other Findings     Assessment/Diagnosis   Axis I: major  depression disorder  Axis II: deferred  Axis III: atrial fibrillation, hypertension, diabetes II       Treatment Plan:     Medication Management and Education: pt is doing well on wellbutrin XL 300 mg daily and it is continued at this dose.  Pt reports she is getting good sleep with trazodone 25 mg nightly and medication continued.    Medical Issues and referrals: Pt has atrial fibrillation and sees PCP for this      Risk Assessment:     No significant changes to initial risk assessment performed on day 11/22/14 have been made and any/all changes in risk assessment will be updated as they occur    The patient does not appear to be at imminent risk of self-harm or violence, though any future actions are unpredictable.     -Risk factors include:    AGE: 35   GENDER: female   PSYCH DIAGNOSIS: MDD,    MEDICAL STATUS: atrial fib, hypercholesteremia, hypertension   PT'S PERCEPTION OF MEDICAL STATUS: stable   PAIN STATUS AND ABILITY TO ALLEVIATE PAIN:denies   OTHER TROUBLING SYMPTOMS (command hallucinations or akethesia): denies   OTHER DISABILITIES FROM ILLNESS OR PERCEIVED AS SUCH: denies   MEDICATION SIDE EFFECTS or BLACK BOX WARNINGS: reviewed   PRIOR PSYCHIATRIC DISORDERS: No   PAST SELF HARM OR SUICIDE ATTEMPTS: No   ACCESS TO FIRE ARMS/WEAPONS/LARGE CACHE OF PILLS: no              SECURING OF GUNS: n/a   SUBSTANCE ABUSE (CURRENT OR HISTORIC): Yes in recent recovery    -Protective factors include:   ENGAGING IN TREATMENT: Yes   COMPLIANT WITH MEDICATIONS AND TREATMENT RECOMMENDATIONS:                Yes   SUPPORT SYSTEM: Yes, explain: son and family   EMPLOYED OR EMPLOYABLE: Yes   RELIGIOUS OR SPIRITUAL BELIEFS: Yes   EMOTIONAL TIES TO PEOPLE and/or PETS: Yes   THERAPEUTIC ALLIANCES: Yes- Nancy Commisso   ACTIVE IN RECOVERY COMMUNITIES: No   OTHER:     Forward thinking: Yes -plans to go to [place] in a few years to retire    Risk for violence:    LOW GIVEN NO HISTORY OF VIOLENCE OR AGGRESSION   NO AGGRESSIVE OR  HOMICIDAL IDEATION    Admission to hospital: is not currently indicated or involutary commitment level is not met    Patient is able contract for safety and agrees to alert staff and/or family, call 911 or go directly to ED if starts feeling unsafe.        Recommendations and Disposition:  Review with patient:   Discussed risks and benefits of treatment options as well as potential side effects and medication interactions. Patient agreed to continue with safety plan which includes calling 911 and/or going to ER if needed.  Patient also has number to [facility] 24/7 central access line, and is aware of urgent care/walk-in clinic at the [facility].  All questions answered and concerns addressed.     Follow up appointment:   Date and Time: 6 weeks  Patient aware to call 911 or go to [facility] or ER in case of psychiatric emergency

## 2014-12-01 NOTE — Patient Instructions (Addendum)
Continue wellbutrin XR 300mg  daily  Continue trazodone 25 mg at bedtime daily    Return in 6 weeks    For urgent assistance, go to  [facility] in [place], or local Emergency Room, anytime if necessary. [facility] has walk-in hours Monday thru Saturday, Noon to 8pm, excluding major holidays.  We close at 4pm on the first Wednesday of every month.  Time-limited, solution-focused psychotherapy programs are available thru [facility] by calling [phone].  Abstain from alcohol and/or illegal drugs as they interfere with psychiatric medications    Take all medications as prescribed. Do not take extra medications or stop any medications without first talking to and getting instructions to do so from your outpatient providers.    Immediately go to the nearest emergency department or call 911 if you have any thoughts of wanting to harm yourself or others, or for any other crisis.  Consult with your pharmacist if you questions about your medications, their side effects or possible interactions with other medications you take.

## 2014-12-07 ENCOUNTER — Ambulatory Visit (INDEPENDENT_AMBULATORY_CARE_PROVIDER_SITE_OTHER): Payer: Medicare Other

## 2014-12-07 ENCOUNTER — Ambulatory Visit (INDEPENDENT_AMBULATORY_CARE_PROVIDER_SITE_OTHER): Payer: Self-pay | Admitting: Cardiovascular Disease

## 2014-12-07 DIAGNOSIS — F1021 Alcohol dependence, in remission: Secondary | ICD-10-CM

## 2014-12-07 DIAGNOSIS — F32A Depression, unspecified: Secondary | ICD-10-CM

## 2014-12-07 DIAGNOSIS — F329 Major depressive disorder, single episode, unspecified: Secondary | ICD-10-CM

## 2014-12-07 NOTE — Progress Notes (Signed)
Individual Therapy Session Note      Date:  12-07-2014    .  Time in: 1 p.m.         Time out: 1:50 p.m.    Duration of Session:  50 minutes    Current Focus:  Relationships within family    Patient Stated Goals:  Reduce depression; engage with father    Intervention: Cognitive/behavioral therapy; reframing    Data: Amiyrah brought a picture of her siblings, including a photo of her brother who died at age 61 of leukemia and other cancers.  She described her relationships with each of her sisters and with her daughters and their partners.  She described her mother as suffering from severe dementia at age 38 and said that over the years she had been more like her mother than the other sisters, i.e. Nija and her mother are both "sweet natured, kind, and easy going."   She said that only in the last two years, when her younger daughter lost custody of her small child and became homeless, did Dunlo begin to drink heavily, primarily to avoid her own painful feelings about her daughter.  Since she became sober she has reached out to her daughter.  For now the daughter's boyfriend is in jail for domestic violence against her daughter.  She bought her daughter a trailer recently so she could live [place] a campground.  No one in the family is supportive of the daughter except Deliliah, because of the daughter's previous drug use and current drinking.     Because it was difficult following Jiyah's non-sequential report of her family and various events, I pointed out that she presented as somewhat vague and uncertain about her thoughts and feelings.  I wondered if she actually felt unsettled, indecisive, about her life and what actions she should take on any give day on any subject. She said yes, and became tearful.  She is confused often, she said, about whether she is doing the "right" thing.  Her father left when she was 6 and her mother left her in the care of her slightly older sister.   I suggested that at age 63  she was probably uncertain most of the time about what to do, about what was right.  And she continues to be uncertain.  She is especially uncertain about whether and how she should try to reconnect with her father.  He presents as ill most of the time,  but interested in talking with her even though she is the one to call him.   He has never reached out to her and her siblings.         Behavioral Observations:  Daphnie presented as well dressed and eager to talk in therapy although in the waiting room before the session she indicated a willingness to let her insistent five-year-old granddaughter join [place] in the therapy session. I said that the granddaughter should stay in the waiting room if possible because it would be difficult otherwise for Hellon to express her feelings freely.  This was another indication to me of how Raine presented as uncertain, unable to make a sound decision on her own behalf.         Assessment/response to intervention/skills and/or knowledge gained:  Maricella was able late in the session to recognize her inability to trust her judgment and to therefore present as vague and uncertain.  This will be the focus of therapy.        Plan:  Eilene Ghazi  will return to individual therapy as scheduled next week.            Safety Planning:  PHQ-9 score:   1  One night of inability to sleep throughout the night  x[ ]  Assessed for suicide or self-harm  [ x] Assessed for alcohol or drug use or abuse     [ ]  Patient is at moderate risk for suicide but not in need of immediate inpatient treatment.      Patient agrees to safety plan including calling 911 and/or going to ER.     Durene Romans. Nicolo Tomko, LCSW

## 2014-12-14 ENCOUNTER — Ambulatory Visit (INDEPENDENT_AMBULATORY_CARE_PROVIDER_SITE_OTHER): Payer: Medicare Other

## 2014-12-14 DIAGNOSIS — F1021 Alcohol dependence, in remission: Secondary | ICD-10-CM

## 2014-12-14 DIAGNOSIS — F1099 Alcohol use, unspecified with unspecified alcohol-induced disorder: Secondary | ICD-10-CM

## 2014-12-14 DIAGNOSIS — F32A Depression, unspecified: Secondary | ICD-10-CM

## 2014-12-14 DIAGNOSIS — F419 Anxiety disorder, unspecified: Secondary | ICD-10-CM

## 2014-12-14 NOTE — Progress Notes (Signed)
Individual Therapy Session Note      Date:  12-14-2014    .  Time in:   1 p.m.        Time out:  1:50 p.m.     Duration of Session:  50 minutes    Current Focus: Relationships with daughters and father    Patient Stated Goals: Reduce anxiety; improve family relationships      Intervention: Cognitive/behavioral therapy; reframing    Data: Over the weekend, Lisa Hampton helped her youngest daughter get settled at a trailer/RV park in [place].  This daughter and her daughter's boyfriend are both unemployed; the daughter has a drinking problem.  Both will need to find work soon so they can pay rent and expenses.  Some family members are doubtful that Lisa Hampton's daughter will be able to get and stay sober and employed.  Lisa Hampton feels she owed her daughter this much help for leaving her life in [place] (albeit with an alcoholic and unemployed, now incarcerated, boyfriend.)    She described her oldest daughter's somewhat punitive nature, holding Lisa Hampton responsible for Lisa Hampton's youngest granddaughter's  (age 8) bad behavior.  Lisa Hampton appears to not take the criticism very personally; she lives in the house as a nanny might but with a separate room in the basement with her TV and other interests.   She is eager to move to [place] in about a year where she will be retired and with her close female friend who is now in [place].  Lisa Hampton said she had not thought about her father too much lately; she has been busy with parenting and grandparenting.        Behavioral Observations:  Lisa Hampton seemed somewhat remote, with blunted affect, as she spoke of her relationships with her daughters and her father.  She is eager for her "adult vacation" starting Thursday.            Assessment/response to intervention/skills and/or knowledge gained:  Lisa Hampton is somewhat limited in her ability to reflect on her feelings and needs.  She is managing her time and energy to serve others in the family.        Plan: Lisa Hampton  will return for individual therapy on August 8.            Safety Planning:  PHQ-9 score:  1  Occasional early awakening; lack of sleep  [ x] Assessed for suicide or self-harm  [x ] Assessed for alcohol or drug use or abuse     [ ]  Patient is at moderate risk for suicide but not in need of immediate inpatient treatment.      Patient agrees to safety plan including calling 911 and/or going to ER.     Durene Romans. Scout Guyett, LCSW

## 2014-12-21 ENCOUNTER — Ambulatory Visit: Payer: Medicare Other

## 2014-12-28 ENCOUNTER — Ambulatory Visit (INDEPENDENT_AMBULATORY_CARE_PROVIDER_SITE_OTHER): Payer: Medicare Other

## 2014-12-28 DIAGNOSIS — F1099 Alcohol use, unspecified with unspecified alcohol-induced disorder: Secondary | ICD-10-CM

## 2014-12-28 DIAGNOSIS — F419 Anxiety disorder, unspecified: Secondary | ICD-10-CM

## 2014-12-28 DIAGNOSIS — F1021 Alcohol dependence, in remission: Secondary | ICD-10-CM

## 2014-12-28 NOTE — Progress Notes (Signed)
Individual Therapy Session Note      Date:  12-28-2014    .  Time in:    1 p.m.      Time out:  1:50 p.m.    Duration of Session:  50 minutes    Current Focus:  Relationships within family; vacation    Patient Stated Goals:  Reduce depression and anxiety; maintain sobriety    Intervention: Cognitive/behavioral therapy; reframing    Data:  Lisa Hampton talked about her week-long vacation with two of her sisters and her ongoing difficulties with two other sisters.  She realized on the trip that she has great difficulty sustaining interest in mundane activities for longer than 5 or 10 minutes.  She recalled that she had trouble concentration and then remembering ideas when she was in school.  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   She has never revealed her concerns to anyone, but she is embarrassed about it.   We discussed the feasibility of her having a slight learning disability that involves concentration and recall.  She agreed that was possible.  She seemed relieved.  We discussed how she has come to compensate for that difficulty over the years.  Lisa Hampton said she feels less anxious about her relationship with her father but is still concerned about him and will call him soon.  She will also consider visiting her mother in [place] in spite of her mother's advanced state of dementia.      Behavioral Observations:  Lisa Hampton presented as casually dressed with bright   affect.  She felt happier, she said, because of being with her sisters.      Assessment/response to intervention/skills and/or knowledge gained:  Lisa Hampton talked about and learned more about her issues around concentration.        Plan: Lisa Hampton will return for individual  therapy as scheduled in three weeks.            Safety Planning:  PHQ-9 score:  1  Some difficulty concentrating  [ x] Assessed for suicide or self-harm  [ x] Assessed for alcohol or  drug use or abuse     [ ]  Patient is at moderate risk for suicide but not in need of immediate inpatient treatment.      Patient agrees to safety plan including calling 911 and/or going to ER.     Durene Romans. Yittel Emrich, LCSW

## 2015-01-04 ENCOUNTER — Other Ambulatory Visit
Admission: RE | Admit: 2015-01-04 | Discharge: 2015-01-04 | Disposition: A | Discharge status: Home / Self Care | Payer: Medicare Other | Source: Ambulatory Visit | Attending: Gastroenterology | Admitting: Gastroenterology

## 2015-01-04 ENCOUNTER — Ambulatory Visit: Payer: Medicare Other

## 2015-01-04 ENCOUNTER — Ambulatory Visit: Payer: Self-pay

## 2015-01-04 DIAGNOSIS — K5730 Diverticulosis of large intestine without perforation or abscess without bleeding: Secondary | ICD-10-CM

## 2015-01-04 DIAGNOSIS — K5790 Diverticulosis of intestine, part unspecified, without perforation or abscess without bleeding: Secondary | ICD-10-CM | POA: Insufficient documentation

## 2015-01-04 DIAGNOSIS — D123 Benign neoplasm of transverse colon: Secondary | ICD-10-CM | POA: Insufficient documentation

## 2015-01-04 DIAGNOSIS — D124 Benign neoplasm of descending colon: Secondary | ICD-10-CM | POA: Insufficient documentation

## 2015-01-04 DIAGNOSIS — D125 Benign neoplasm of sigmoid colon: Secondary | ICD-10-CM | POA: Insufficient documentation

## 2015-01-04 DIAGNOSIS — K635 Polyp of colon: Secondary | ICD-10-CM

## 2015-01-04 DIAGNOSIS — Z1211 Encounter for screening for malignant neoplasm of colon: Secondary | ICD-10-CM | POA: Insufficient documentation

## 2015-01-04 DIAGNOSIS — K621 Rectal polyp: Secondary | ICD-10-CM | POA: Insufficient documentation

## 2015-01-05 LAB — LAB USE ONLY - HISTORICAL SURGICAL PATHOLOGY

## 2015-01-11 ENCOUNTER — Other Ambulatory Visit: Payer: Self-pay | Admitting: Psychiatric/Mental Health

## 2015-01-11 ENCOUNTER — Ambulatory Visit: Payer: Medicare Other

## 2015-01-11 MED ORDER — LORAZEPAM 0.5 MG PO TABS
0.5000 mg | ORAL_TABLET | Freq: Two times a day (BID) | ORAL | Status: DC | PRN
Start: 2015-01-11 — End: 2015-06-14

## 2015-01-11 NOTE — Progress Notes (Signed)
Pt history - pt has atrial fib x 8 years, she has been on anticoagulants during that time.  Pt called this am, distraught with complaints of severe anxiety.  Pt had had colonoscopy 9 days ago with polyps removed.  During week she had increased bleeding.  Pt had urgent repeat colonoscopy with attention to bleeding sites.  Pt was instructed to stay off anticoagulants for 2 weeks.  Pt was instructed to speak with GI and cardiac providers which she did.  They are both OK that she take an axiolytic for short term.

## 2015-01-18 ENCOUNTER — Ambulatory Visit (INDEPENDENT_AMBULATORY_CARE_PROVIDER_SITE_OTHER): Payer: Medicare Other

## 2015-01-18 DIAGNOSIS — F419 Anxiety disorder, unspecified: Secondary | ICD-10-CM

## 2015-01-19 NOTE — Progress Notes (Signed)
Individual Therapy Session Note      Date:  01-18-2015     .  Time in:  1 p.m.        Time out: 1:50 p.m.    Duration of Session: 50 minutes    Current Focus: Medical concerns; family relationships    Patient Stated Goals: Reduce depression and anxiety      Intervention: Cognitive/behavioral therapy; reframing    Data:  Peyten brought her 61-year-old granddaughter into the session because she did not feel comfortable leaving her in the waiting room without supervision.  She reported being hospitalized with complications following a routine colonoscopy.  She was dissatisfied with the care she received at the hospital, including the fact that she was not given her medication for depression and anxiety and for her heart condition.  She became extremely anxious when she got home; she called Ms. Kellie Shropshire and got a prescription for Ativan, prn.    She reported some relief but is still uncertain about the condition of her colon; she will have a follow-up appointment in a few months.  She reported being overwhelmed with childcare responsibilities, caring for three granddaughters who she says "do not listen" to her. Her daughter and son in law do not seem supportive of her, she said.  Her son in law in impatient with her medical condition.  I suggested that she might want to consider an alternative to providing childcare, if her anxiety continues.   She said she could live with her friend in [place] at any time, and will reconsider her offer in a few weeks.    Behavioral Observations: Margot presented as distracted and anxious, hand wringing; she was interrupted by her granddaughter throughout the session in spite of Amand's attempts at redirection.            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  She has difficulty recognizing her own emotional needs and her right to receive care from others.  She  needs reassurance. This will be the focus of her therapy.         Plan: Ashliegh will return to therapy as scheduled next week.            Safety Planning:  PHQ-9 score:  3  Mild depression; anhedonia  [x ] Assessed for suicide or self-harm  [x ] Assessed for alcohol or drug use or abuse     [ ]  Patient is at moderate risk for suicide but not in need of immediate inpatient treatment.      Patient agrees to safety plan including calling 911 and/or going to ER.     Durene Romans. Shantale Holtmeyer, LCSW

## 2015-01-27 ENCOUNTER — Ambulatory Visit (INDEPENDENT_AMBULATORY_CARE_PROVIDER_SITE_OTHER): Payer: Medicare Other

## 2015-01-27 DIAGNOSIS — F329 Major depressive disorder, single episode, unspecified: Secondary | ICD-10-CM

## 2015-01-27 DIAGNOSIS — F32A Depression, unspecified: Secondary | ICD-10-CM

## 2015-01-27 NOTE — Progress Notes (Signed)
Individual Therapy Session Note      Date:  01-27-2015    .  Time in:  1 p.m.        Time out: 1:50 p.m.    Duration of Session: 50 minutes    Current Focus: Relationships with daughters    Patient Stated Goals:  Reduce depression and anxiety      Intervention: Cognitive/behavioral therapy; reframing    Data: Kaiah spoke of her connection to her granddaughters, and to her daughters.  She spent the weekend with the youngest of two children born to her younger daughter who is homeless and unemployed.  The child is almost 32 years old and is cared for by her father, a [place] here on a work visa.  The other grandchild, now 20, lives with her grandfather (Yeva's former husband) and his wife.  Hava's daughter rarely sees her children.  Lauraann spoke of her own personal difficulty with staying supportive of her daughter (she recently paid her trailer rent) while at the same time disapproving of her behavior.  She wants to hold her daughter responsible for her own decisions but typically ends up helping her.  Her daughter is drinking heavily.  I suggested Lior consider attending [facility].  She said she would.        Behavioral Observations: Virgil presented with bright affect but became sad when she talked about her youngest daughter.            Assessment/response to intervention/skills and/or knowledge gained:  Kampbell is working toward insight into her own motivation and behavior.  [facility] might help with that process.        Plan: Velma will return to individual therapy as scheduled next week.          Safety Planning:  PHQ-9 score: 6  Mild depression   [ x] Assessed for suicide or self-harm  [x ] Assessed for alcohol or drug use or abuse     [ ]  Patient is at moderate risk for suicide but not in need of immediate inpatient treatment.      Patient agrees to safety plan including calling 911 and/or going to ER.     Durene Romans. Decorian Schuenemann, LCSW

## 2015-02-01 ENCOUNTER — Ambulatory Visit: Payer: Medicare Other

## 2015-02-03 ENCOUNTER — Ambulatory Visit (INDEPENDENT_AMBULATORY_CARE_PROVIDER_SITE_OTHER): Payer: Medicare Other

## 2015-02-03 DIAGNOSIS — F419 Anxiety disorder, unspecified: Secondary | ICD-10-CM

## 2015-02-03 DIAGNOSIS — F1021 Alcohol dependence, in remission: Secondary | ICD-10-CM

## 2015-02-03 NOTE — Progress Notes (Signed)
Individual Therapy Session Note      Date:  02-03-2015    .  Time in:   11:30 a.m.       Time out: 12:20 a.m.    Duration of Session:  50 minutes    Current Focus: Relationship with younger daughter    Patient Stated Goals:  Reduce depression      Intervention: Cognitive/behavioral therapy; reframing    Data:  Lisa Hampton and her oldest daughter plan to attend [facility] meetings together starting this week.  Lisa Hampton spoke of several people she has known with alcohol problems and her feelings now about her younger daughter, age 61.  She described drug and alcohol issues and behavior that began when that daughter was about age 61 and continue to the present.  Her daughter has two children who she has not been able to care for since they were infants.  Lisa Hampton wants to stop enabling her daughter.  We discussed the nature of the [facility] meetings, the philosophy behind AA, and what she might expect from the meetings.      Behavioral Observations: Lisa Hampton presented as serious with blunted affect.  She was able to maintain focus throughout the session.       Assessment/response to intervention/skills and/or knowledge gained: Lisa Hampton is using therapy time to consider serious issues affecting her and her family.  She is more focused and committed to positive change.        Plan:  Lisa Hampton will return to individual therapy as scheduled next week.          Safety Planning:  PHQ-9 score:  2  Some issues with sleep and fatigue occasionally  [x ] Assessed for suicide or self-harm  [ x] Assessed for alcohol or drug use or abuse     [ ]  Patient is at moderate risk for suicide but not in need of immediate inpatient treatment.      Patient agrees to safety plan including calling 911 and/or going to ER.     Durene Romans. Haneefah Venturini, LCSW

## 2015-02-07 ENCOUNTER — Other Ambulatory Visit: Payer: Self-pay | Admitting: Psychiatric/Mental Health

## 2015-02-07 DIAGNOSIS — F411 Generalized anxiety disorder: Secondary | ICD-10-CM

## 2015-02-08 ENCOUNTER — Ambulatory Visit: Payer: Medicare Other

## 2015-02-10 ENCOUNTER — Ambulatory Visit (INDEPENDENT_AMBULATORY_CARE_PROVIDER_SITE_OTHER): Payer: Medicare Other

## 2015-02-10 DIAGNOSIS — F1099 Alcohol use, unspecified with unspecified alcohol-induced disorder: Secondary | ICD-10-CM

## 2015-02-10 DIAGNOSIS — F1021 Alcohol dependence, in remission: Secondary | ICD-10-CM

## 2015-02-10 NOTE — Progress Notes (Signed)
Individual Therapy Session Note      Date:  02-10-2015     .  Time in:  11:30 a.m.      Time out:  12:20 p.m.    Duration of Session:  50 minutes    Current Focus: History of parenting    Patient Stated Goals: Reduce anxiety and depression; maintain sobriety      Intervention: Cognitive/behavioral therapy; reframing    Data:  Shandrell has not yet been to an [employer] but plans to do so this week, with her oldest daughter.  She attempted to provide a history of substance abuse in her family, starting with her grandparents, and recalled many details.  She said she drank and used marijuana as a young adult but typically has not been a drug user or drinker.  She could not recall many details of her life with her youngest daughter, however,  including what her daughter did after high school.  She remembered how her daughter eventually moved to [place] to live with Kelis and the difficulties they both had with drinking then.  Sharice often presents with limited long-term memory and it is not clear whether she is purposefully blocking information or if she had periods when she was not able to stay alert and retain information.  We talked about whether the current  childcare arrangement she has with her oldest daughter contributes to or detracts from her mental and physical well being.          Behavioral Observations:  Kewanda presents as tentative as she discusses her family history as well as her current living situation.        Assessment/response to intervention/skills and/or knowledge gained:  Annahi is committed to the therapeutic process but often presents with little memory or insight into events of the past.        Plan: Tanisia will return for individual therapy as scheduled next week.            Safety Planning:  PHQ-9 score:  2  Hypersomnia, fatigue  [x ] Assessed for suicide or self-harm  [x ] Assessed for alcohol or drug use or abuse     [ ]  Patient is at moderate risk for suicide but not in  need of immediate inpatient treatment.      Patient agrees to safety plan including calling 911 and/or going to ER.     Durene Romans. Jayquon Theiler, LCSW

## 2015-02-12 ENCOUNTER — Ambulatory Visit (INDEPENDENT_AMBULATORY_CARE_PROVIDER_SITE_OTHER): Payer: Medicare Other | Admitting: Psychiatric/Mental Health

## 2015-02-12 ENCOUNTER — Encounter: Payer: Self-pay | Admitting: Psychiatric/Mental Health

## 2015-02-12 DIAGNOSIS — F411 Generalized anxiety disorder: Secondary | ICD-10-CM

## 2015-02-12 DIAGNOSIS — F5105 Insomnia due to other mental disorder: Secondary | ICD-10-CM

## 2015-02-12 MED ORDER — BUPROPION HCL ER (XL) 300 MG PO TB24
300.0000 mg | ORAL_TABLET | Freq: Every morning | ORAL | Status: DC
Start: 2015-02-12 — End: 2015-06-14

## 2015-02-12 MED ORDER — TRAZODONE HCL 50 MG PO TABS
50.0000 mg | ORAL_TABLET | Freq: Every evening | ORAL | Status: AC
Start: 2015-02-12 — End: ?
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
## 2015-02-15 ENCOUNTER — Ambulatory Visit: Payer: Medicare Other

## 2015-02-17 ENCOUNTER — Ambulatory Visit (INDEPENDENT_AMBULATORY_CARE_PROVIDER_SITE_OTHER): Payer: Medicare Other

## 2015-02-17 DIAGNOSIS — F1021 Alcohol dependence, in remission: Secondary | ICD-10-CM

## 2015-02-17 DIAGNOSIS — F411 Generalized anxiety disorder: Secondary | ICD-10-CM

## 2015-02-17 DIAGNOSIS — F32A Depression, unspecified: Secondary | ICD-10-CM

## 2015-02-17 NOTE — Progress Notes (Signed)
Individual Therapy Session Note      Date:  02-17-2015    .  Time in:   11:30 a.m.       Time out:  12:20 p.m.    Duration of Session:  50 minutes    Current Focus:  Mild depression; issues with daughters    Patient Stated Goals:  Reduce depression      Intervention: Cognitive/behavioral therapy; reframing    Data: Lisa Hampton was tearful as she told of her grandson, age 61, moving recently to [place] with his father.  She said she had cried almost non stop for several days.  Over the weekend she took the grandson to see his mother, Lisa Hampton youngest daughter who lives in a trailer park with her boyfriend, is unemployed, has no transportation, and who is apparently a heavy user of alcohol and illegal drugs.  Lisa Hampton thought that both the child and her daughter would benefit from a visit with each other and no one would arrange it but her.  Lisa Hampton has not yet attended an [employer] but will try to do so next week.  She is upset that "everyone" expects her to" turn her back on" her daughter.  We talked about alternatives to the current plan of caring for her older daughter's children for the next 18 months.  Also discussed were various ways to manage the oppositional and sometimes aggressive behavior of her five-year-old granddaughter.   Lisa Hampton said she has all but exhausted her supply of Ativan because of the stress of caring for all three children.    .      Behavioral Observations: Lisa Hampton was tearful, her eyes were red, and she slumped in her seat as she spoke of an emotionally painful few days recently and ongoing stress of caring for her daughter's three children every day.        Assessment/response to intervention/skills and/or knowledge gained:  Lisa Hampton is trying to embrace the limitations of helping her alcohol daughter and of caring for her grandchildren.  She needs considerable emotional support in order to consider options.        Plan:  Lisa Hampton will return for individual therapy as  scheduled next week.            Safety Planning:  PHQ-9 score: 5  Mild depression   [x ] Assessed for suicide or self-harm  [x ] Assessed for alcohol or drug use or abuse     [ ]  Patient is at moderate risk for suicide but not in need of immediate inpatient treatment.      Patient agrees to safety plan including calling 911 and/or going to ER.     Durene Romans. Shyane Fossum, LCSW

## 2015-02-22 ENCOUNTER — Ambulatory Visit: Payer: Medicare Other

## 2015-02-24 ENCOUNTER — Ambulatory Visit: Payer: Medicare Other

## 2015-03-03 ENCOUNTER — Ambulatory Visit: Payer: Medicare Other

## 2015-03-10 ENCOUNTER — Ambulatory Visit (INDEPENDENT_AMBULATORY_CARE_PROVIDER_SITE_OTHER): Payer: Medicare Other

## 2015-03-10 DIAGNOSIS — F1994 Other psychoactive substance use, unspecified with psychoactive substance-induced mood disorder: Secondary | ICD-10-CM

## 2015-03-10 DIAGNOSIS — F1021 Alcohol dependence, in remission: Secondary | ICD-10-CM
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
## 2015-03-17 ENCOUNTER — Ambulatory Visit (INDEPENDENT_AMBULATORY_CARE_PROVIDER_SITE_OTHER): Payer: Medicare Other

## 2015-03-17 DIAGNOSIS — F32A Depression, unspecified: Secondary | ICD-10-CM

## 2015-03-17 DIAGNOSIS — F329 Major depressive disorder, single episode, unspecified: Secondary | ICD-10-CM

## 2015-03-17 NOTE — Progress Notes (Signed)
Individual Therapy Session Note      Date:  03-15-2015    .  Time in:   11:30 a.m.       Time out:  12:20 p.m.    Duration of Session:  50 minutes    Current Focus: Family relationships    Patient Stated Goals:  Reduce anxiety and depression      Intervention: Cognitive/behavioral therapy; reframing    Data: Lisa Hampton anticipates visiting her mother soon but needs to get permission from her daughter to be gone for a week.  She talked about the ongoing stress of caring for three grandchildren -- ages 62, 58 and 35 -- who have little discipline at home and are challenging to Lisa Hampton.  We talked about the need to discuss this issue with her daughter and her son in law so all agree to techniques to improve the children's behavior, which is often oppositional.  Lisa Hampton says the children are "spoiled."   Lisa Hampton became cheerful and smiled as she talked about her excitement about moving to [place] the end of next summer.  She is looking online at houses for sale and maintaining contact with her sister and friends who are also planning a move to [place].  She said she is excited about being with her own family and friends again; she said she is lonely living in her daughter's neighborhood among only young couples and children.         Behavioral Observations:  Lisa Hampton presented with bright affect.  She seemed more self assured, less tentative in content and speech.          Assessment/response to intervention/skills and/or knowledge gained:  Lisa Hampton is learning to be more assertive and less accepting of situations that cause her stress and frustration.        Plan: Lisa Hampton will return for individual therapy on November 16.            Safety Planning:  PHQ-9 score: 1  Some difficulty staying asleep    [ x] Assessed for suicide or self-harm  [x ] Assessed for alcohol or drug use or abuse     [ ]  Patient is at moderate risk for suicide but not in need of immediate inpatient treatment.      Patient agrees to safety plan  including calling 911 and/or going to ER.     Lisa Hampton. Guenther Dunshee, LCSW

## 2015-03-24 ENCOUNTER — Ambulatory Visit: Payer: Medicare Other

## 2015-03-31 ENCOUNTER — Ambulatory Visit: Payer: Medicare Other

## 2015-04-07 ENCOUNTER — Ambulatory Visit (INDEPENDENT_AMBULATORY_CARE_PROVIDER_SITE_OTHER): Payer: Medicare Other

## 2015-04-07 DIAGNOSIS — F1021 Alcohol dependence, in remission: Secondary | ICD-10-CM

## 2015-04-07 NOTE — Progress Notes (Signed)
Individual Therapy Session Note      Date:  04-07-2015     .  Time in:  11:30 a.m.         Time out: 12:20 a.m.    Duration of Session: 50 minutes    Current Focus: Relationships with family members    Patient Stated Goals:  Reduce anxiety and depression; maintain sobriety      Intervention: Cognitive/behavioral therapy; reframing    Data: Lisa Hampton described her travel plans over the next several weeks, including a trip to [place] to see her mother who has dementia.   Lisa Hampton has helped her younger daughter with her move to another trailer park and contends that she will cease "bailing her out."  Lisa Hampton is also struggling to manage her grandchildren's behavior in the home.  We discussed positive discipline and limit setting.  Lisa Hampton is eager to retire to [place] and stop child care.     Behavioral Observations: Lisa Hampton presented with bright affect.          Assessment/response to intervention/skills and/or knowledge gained:  Lisa Hampton is receptive to help with managing her grandchildren.          Plan:  Lisa Hampton will be away, traveling, until early January and will return to therapy January 4.                Safety Planning:  PHQ-9 score:  0  No issues  [x ] Assessed for suicide or self-harm  [x ] Assessed for alcohol or drug use or abuse     [ ]  Patient is at moderate risk for suicide but not in need of immediate inpatient treatment.      Patient agrees to safety plan including calling 911 and/or going to ER.     Durene Romans. Seng Larch, LCSW

## 2015-04-14 ENCOUNTER — Ambulatory Visit: Payer: Medicare Other

## 2015-04-21 ENCOUNTER — Ambulatory Visit: Payer: Medicare Other

## 2015-05-26 ENCOUNTER — Ambulatory Visit (INDEPENDENT_AMBULATORY_CARE_PROVIDER_SITE_OTHER): Payer: Medicare Other

## 2015-05-26 DIAGNOSIS — F418 Other specified anxiety disorders: Secondary | ICD-10-CM

## 2015-05-26 NOTE — Progress Notes (Signed)
Individual Therapy Session Note      Date:  05-26-2015    .  Time in:11:30 a.m.          Time out: 12:15 p.m.    Duration of Session:  45 minutes    Current Focus: Planning for the future      Patient Stated Goals:  Reduce anxiety; maintain sobriety      Intervention: Cognitive/behavioral therapy; reframing    Data:  Lisa Hampton recalled her activities over the holidays including caring for grandchildren and visiting her best friend in [place].  She and her daughter have developed a plan for Lisa Hampton to buy a small house and move to [place] in September.  She decided not to give money to her youngest daughter but to save her childcare income for her upcoming retirement.  I urged her to attend [employer] so she can stay focused on her own needs and set appropriate limits on her daughter when necessary.  Lisa Hampton does not want to continue therapy but will call if she needs to come in for a session.      Behavioral Observations: Bright affect.  Alert and enthusiastic about the future.          Assessment/response to intervention/skills and/or knowledge gained: Lisa Hampton was able to reduce anxiety and establish more stable relationships with her daughters and set limits where needed.      Plan:  This was Lisa Hampton's last session.  She will call if she needs an appointment in the future.            Safety Planning:  PHQ-9 score:  0  No problems cited  [x ] Assessed for suicide or self-harm  [x ] Assessed for alcohol or drug use or abuse     [ ]  Patient is at moderate risk for suicide but not in need of immediate inpatient treatment.      Patient agrees to safety plan including calling 911 and/or going to ER.     Durene Romans. Gatlyn Lipari, LCSW

## 2015-06-14 ENCOUNTER — Ambulatory Visit (INDEPENDENT_AMBULATORY_CARE_PROVIDER_SITE_OTHER): Payer: Medicare Other | Admitting: Psychiatric/Mental Health

## 2015-06-14 ENCOUNTER — Encounter: Payer: Self-pay | Admitting: Psychiatric/Mental Health

## 2015-06-14 DIAGNOSIS — F411 Generalized anxiety disorder: Secondary | ICD-10-CM

## 2015-06-14 DIAGNOSIS — F324 Major depressive disorder, single episode, in partial remission: Secondary | ICD-10-CM

## 2015-06-14 DIAGNOSIS — F418 Other specified anxiety disorders: Secondary | ICD-10-CM

## 2015-06-14 MED ORDER — BUPROPION HCL ER (XL) 300 MG PO TB24
300.0000 mg | ORAL_TABLET | Freq: Every morning | ORAL | Status: DC
Start: 2015-06-14 — End: 2015-08-31

## 2015-06-14 NOTE — Progress Notes (Signed)
Psychiatry Outpatient Follow-Up Note    06/14/2015    Lisa Hampton, a 60 y.o. female, presented for follow-up appointment for anxiety and depression.      Interval History:     SUBJECTIVE: "I have a realtor in [place] and I am looking for a house."   60 yr old female continues for anxiety and alcohol use disorder in remission.  Pt reports that she is sleeping well and feels markedly improved. She continues with Wellbutrin XL 300 mg daily.  She denies anhedonia, poor appetite, trouble concentrating and feeling bad about herself.  She saw the  therapist in office and feels that she was helped by this.  She denies worrying.  She continues to deny mood swings.     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     Current Outpatient Prescriptions on File Prior to Visit   Medication Sig Dispense Refill   . acetaminophen (TYLENOL) 325 MG tablet Take 2 tablets (650 mg total) by mouth every 6 (six) hours as needed for Pain.     . Ascorbic Acid (VITAMIN C) 1000 MG tablet Take 1,000 mg by mouth daily.     Marland Kitchen aspirin 81 MG chewable tablet Chew 81 mg by mouth daily.     Marland Kitchen atorvastatin (LIPITOR) 40 MG tablet      . buPROPion XL (WELLBUTRIN XL) 300 MG 24 hr tablet Take 1 tablet (300 mg total) by mouth every morning. 30 tablet 2   . Cholecalciferol (VITAMIN D) 1000 UNIT tablet Take 1,000 Units by mouth daily.     Marland Kitchen diltiazem (CARDIZEM CD) 180 MG 24 hr capsule Take  180 mg by mouth daily.     Marland Kitchen lisinopril (PRINIVIL,ZESTRIL) 20 MG tablet Take 20 mg by mouth daily.     Marland Kitchen LORazepam (ATIVAN) 0.5 MG tablet Take 1 tablet (0.5 mg total) by mouth every 12 (twelve) hours as needed for Anxiety. Take until back on anti coagulant in 2 weeks. 30 tablet 0   . metFORMIN (GLUCOPHAGE-XR) 500 MG 24 hr tablet Take 500 mg by mouth every morning with breakfast.     . Omega-3 Fatty Acids (OMEGA-3 FISH OIL) 500 MG Cap Take by mouth.     . rosuvastatin (CRESTOR) 40 MG tablet Take 40 mg by mouth nightly.     . traZODone (DESYREL) 50 MG tablet Take 1 tablet (50 mg total) by mouth nightly. 30 tablet 2   . warfarin (COUMADIN) 2.5 MG tablet      . warfarin (COUMADIN) 5 MG tablet      . enoxaparin (LOVENOX) 80 MG/0.8ML Solution      . warfarin (COUMADIN) 3 MG tablet Take 3 mg by mouth daily. Will have INR checked Friday 1/29.  Dose being increased to get INR to target of 2-3.    Took 5mg  Monday, 6mg  Tuesday, 4mg  tonight, 4mg  tomorrow, and then have checked Friday.  No current facility-administered medications on file prior to visit.         Review Of Systems:     A complete review of systems was negative except for psychiatric, as noted in HPI.    Physical Exam:     GENERAL: no apparent distress  NERVOUS: CN2-12 grossly intact  MUSCULOSKELTAL: Normal   GAIT: normal gait        Mental Status Evaluation:     APPEARANCE: Well groomed, casually dressed, appears stated age.    BEHAVIOR: Cooperative, good eye contact.       SPEECH AND LANGUAGE:  Regular rate, rhythm, volume and tone; linear fluent and goal directed; appropriate.  MOOD: euthymic  AFFECT: congruent; pleasant  THOUGHT PROCESSES: Logical, goal directed  ASSOCIATIONS:  Appear intact  THOUGHT CONTENT: No hallucinations or overt delusions; no obsessions  SAFETY:   No suicidal/homicidal/self injury ideation/intent/plan and pt is able to contract for safety   JUDGEMENT and INSIGHT:  Appear intact      ORIENTATION: fully oriented to person, place,  time  MEMORY:  grossly intact    ATTENTION AND CONCENTRATION: normal /able to participate in this exam  FUND OF KNOWLEDGE: age/station appropriate      Assessment/Diagnosis   Pt is less involved in her adult children's issues and she is trying to separate from children's problems.  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  Medication Management and Educatio  Continue wellbutrin XL 300 mg daily   Continue trazodone 25 mg nightly   Medical Issues and referrals: Pt has atrial fibrillation and sees PCP for this      Risk Assessment:     No significant changes to initial risk assessment performed on day 02/12/15 have been made and any/all changes in risk assessment will be updated as they occur    The patient does not appear to be at imminent risk of self-harm or violence, though any future actions are unpredictable.     -Risk factors include:    AGE: 68   GENDER: female   PSYCH DIAGNOSIS: MDD,    MEDICAL STATUS: atrial fib, hypercholesteremia, hypertension   PT'S PERCEPTION OF MEDICAL STATUS: stable   PAIN STATUS AND ABILITY TO ALLEVIATE PAIN:denies   OTHER TROUBLING SYMPTOMS (command hallucinations or akethesia): denies   OTHER DISABILITIES FROM ILLNESS OR PERCEIVED AS SUCH: denies   MEDICATION SIDE EFFECTS or BLACK BOX WARNINGS: reviewed   PRIOR PSYCHIATRIC DISORDERS: No   PAST SELF HARM OR SUICIDE ATTEMPTS: No   ACCESS TO FIRE ARMS/WEAPONS/LARGE CACHE OF PILLS: no              SECURING OF GUNS: n/a   SUBSTANCE ABUSE (CURRENT  OR HISTORIC): Yes in recent recovery    -Protective factors include:   ENGAGING IN TREATMENT:  Yes   COMPLIANT WITH MEDICATIONS AND TREATMENT RECOMMENDATIONS:                Yes   SUPPORT SYSTEM: Yes, explain: son and family   EMPLOYED OR EMPLOYABLE: Yes   RELIGIOUS OR SPIRITUAL BELIEFS: Yes   EMOTIONAL TIES TO PEOPLE and/or PETS: Yes   THERAPEUTIC ALLIANCES: Yes- Nancy Commisso   ACTIVE IN RECOVERY COMMUNITIES: No   OTHER:     Forward thinking: Yes -plans to go to [place] in a few years to retire    Risk for violence:    LOW GIVEN NO HISTORY OF VIOLENCE OR AGGRESSION   NO AGGRESSIVE OR HOMICIDAL IDEATION    Admission to hospital: is not currently indicated or involutary commitment level is not met    Patient is able contract for safety and agrees to alert staff and/or family, call 911 or go directly to ED if starts feeling unsafe.        Recommendations and Disposition:     Review with patient:   Discussed risks and benefits of treatment options as well as potential side effects and medication interactions. Patient agreed to continue with safety plan which includes calling 911 and/or going to ER if needed.  Patient also has number to [facility] 24/7 central access line, and is aware of urgent care/walk-in clinic at the [facility].  All questions answered and concerns addressed.     Follow up appointment:   Date and Time: 3 months  Patient aware to call 911 or go to [facility] or ER in case of psychiatric emergency

## 2015-08-31 ENCOUNTER — Other Ambulatory Visit: Payer: Self-pay | Admitting: Psychiatric/Mental Health

## 2015-08-31 DIAGNOSIS — F411 Generalized anxiety disorder: Secondary | ICD-10-CM

## 2015-08-31 MED ORDER — BUPROPION HCL ER (XL) 300 MG PO TB24
300.0000 mg | ORAL_TABLET | Freq: Every morning | ORAL | Status: DC
Start: 2015-08-31 — End: 2015-09-22

## 2015-09-06 ENCOUNTER — Ambulatory Visit: Payer: Medicare Other | Admitting: Psychiatric/Mental Health

## 2015-09-08 ENCOUNTER — Ambulatory Visit: Payer: Medicare Other | Admitting: Psychiatric/Mental Health

## 2015-09-17 ENCOUNTER — Telehealth: Payer: Self-pay | Admitting: Psychiatry

## 2015-09-17 NOTE — Telephone Encounter (Signed)
Patient called and reported that she had a seizure while on Vacation in [place]. She states that she was sitting on the coach and her sister observed her starring for seconds as if she lost her conscious so the patient went to the Emergency Room and a full work up for epilepsy was completed and came back normal according to the patient. The patient reports that Her ED doctor advised her to stop Wellbutrin XL 300 mg so she stopped it last Tuesday on 09/14/2015. She also takes Tramadol and I advised her to talk with her PCP weather she needs to continue that. I informed her that we agree with her ED doctor recommendation and since she already stopped Wellbutrin XL no further action needs done. She will make an appointment with her Provider Martie Round to discuss another antidepressant.

## 2015-09-22 ENCOUNTER — Other Ambulatory Visit: Payer: Self-pay | Admitting: Psychiatric/Mental Health

## 2015-09-22 ENCOUNTER — Encounter: Payer: Self-pay | Admitting: Psychiatric/Mental Health

## 2015-09-22 NOTE — Progress Notes (Signed)
Phone call to pt.  She stopped taking Wellbutrin and tramadol on 09/15/15.  Pt has not identified symptoms of depression since then. Pt has not experienced any seizure activity since event on 09/15/15, which was identified as Engineer, materials by neurologist in [place].  Pt had an MRI,ct scan and EEG in ED on 09/15/15 and all were negative.  Pt is seeing PCP, Dr. Delane Ginger today. Pt will remain off wellbutrin and make follow up appointment for next month.

## 2016-01-28 ENCOUNTER — Ambulatory Visit (INDEPENDENT_AMBULATORY_CARE_PROVIDER_SITE_OTHER): Payer: Self-pay | Admitting: Cardiovascular Disease

## 2016-03-27 ENCOUNTER — Other Ambulatory Visit: Payer: Self-pay | Admitting: Cardiovascular Disease

## 2016-03-27 DIAGNOSIS — I482 Chronic atrial fibrillation, unspecified: Secondary | ICD-10-CM

## 2016-04-03 ENCOUNTER — Ambulatory Visit (INDEPENDENT_AMBULATORY_CARE_PROVIDER_SITE_OTHER): Payer: Self-pay

## 2016-04-03 ENCOUNTER — Ambulatory Visit
Admission: RE | Admit: 2016-04-03 | Discharge: 2016-04-03 | Disposition: A | Discharge status: Home / Self Care | Payer: Medicare Other | Source: Ambulatory Visit | Attending: Cardiovascular Disease | Admitting: Cardiovascular Disease

## 2016-04-03 DIAGNOSIS — I517 Cardiomegaly: Secondary | ICD-10-CM | POA: Insufficient documentation

## 2016-04-03 DIAGNOSIS — I358 Other nonrheumatic aortic valve disorders: Secondary | ICD-10-CM | POA: Insufficient documentation

## 2016-04-03 DIAGNOSIS — I081 Rheumatic disorders of both mitral and tricuspid valves: Secondary | ICD-10-CM | POA: Insufficient documentation

## 2016-04-03 DIAGNOSIS — I482 Chronic atrial fibrillation, unspecified: Secondary | ICD-10-CM

## 2017-07-13 IMAGING — DX CHEST PA AND LATERAL
1 series · 2 of 2 positions shown · non-contrast
Comparison: None.

INDICATION: Cough

[Series 1: PA · U · 0.17mm/px · 2 of 2 slices shown]
[im 1/2]
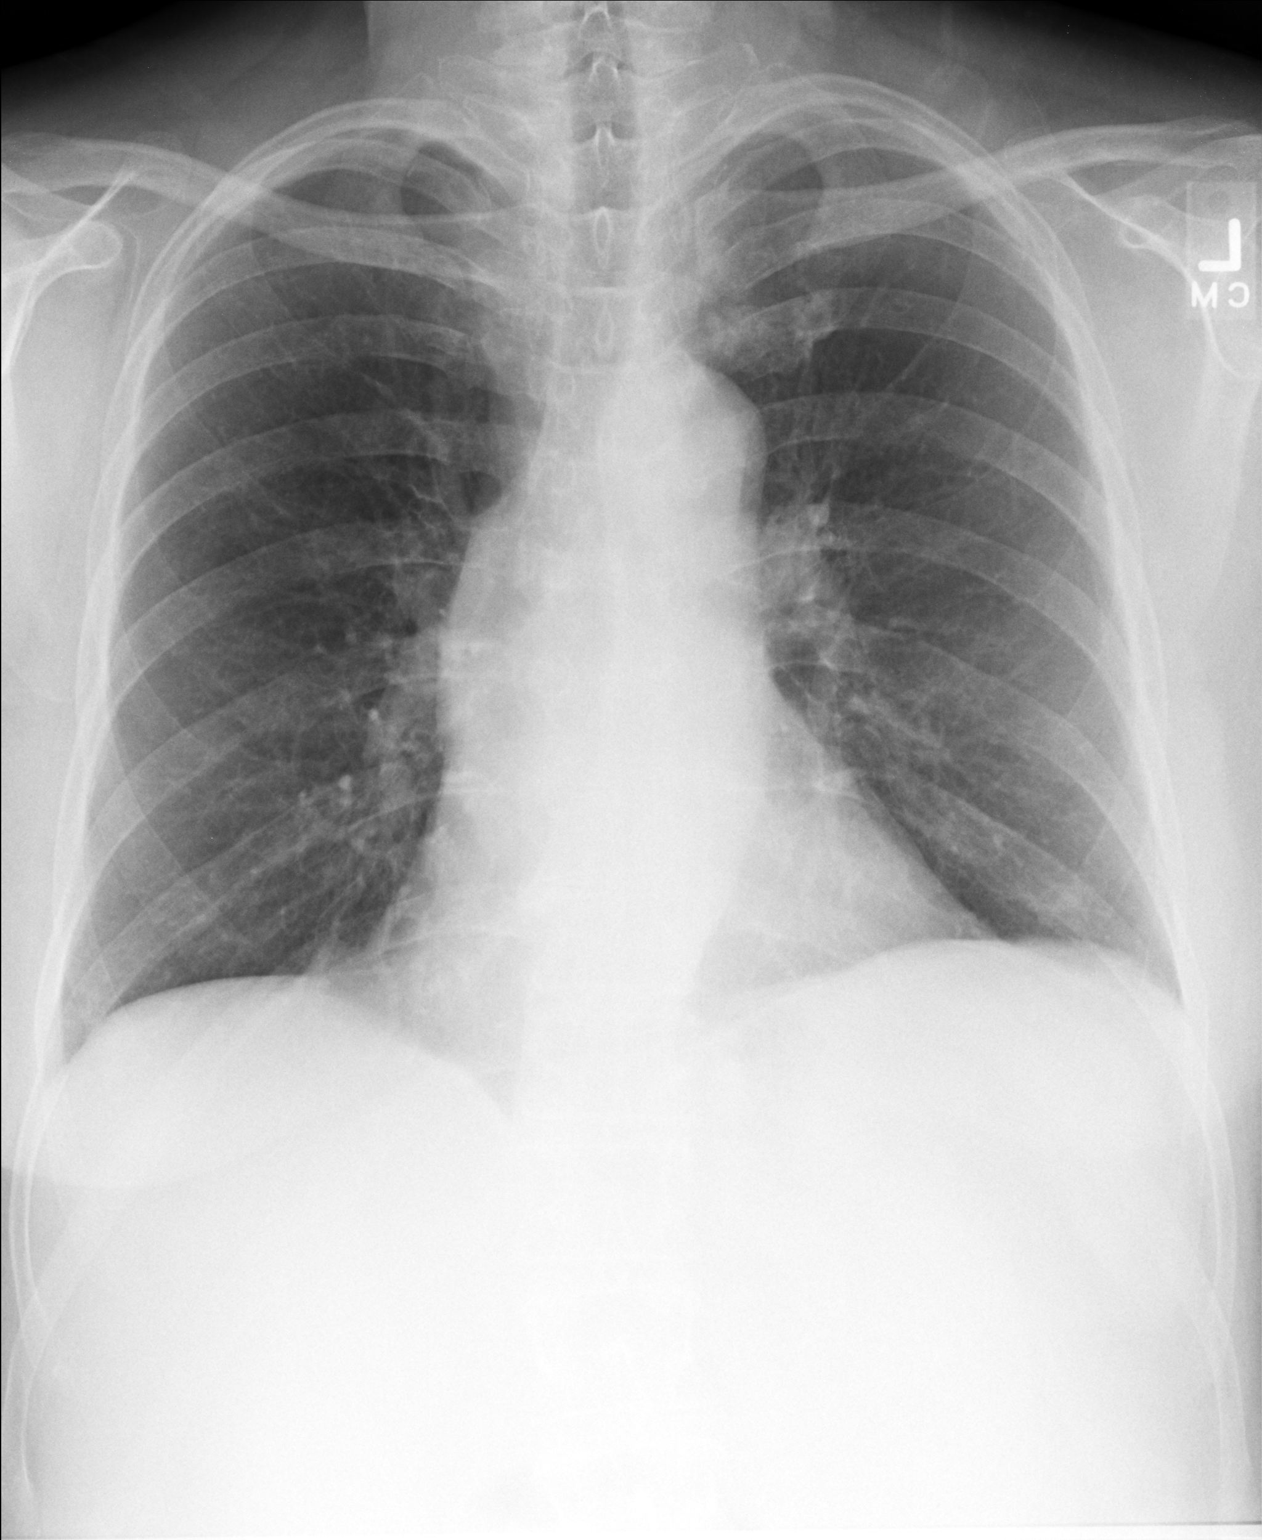
[im 2/2]
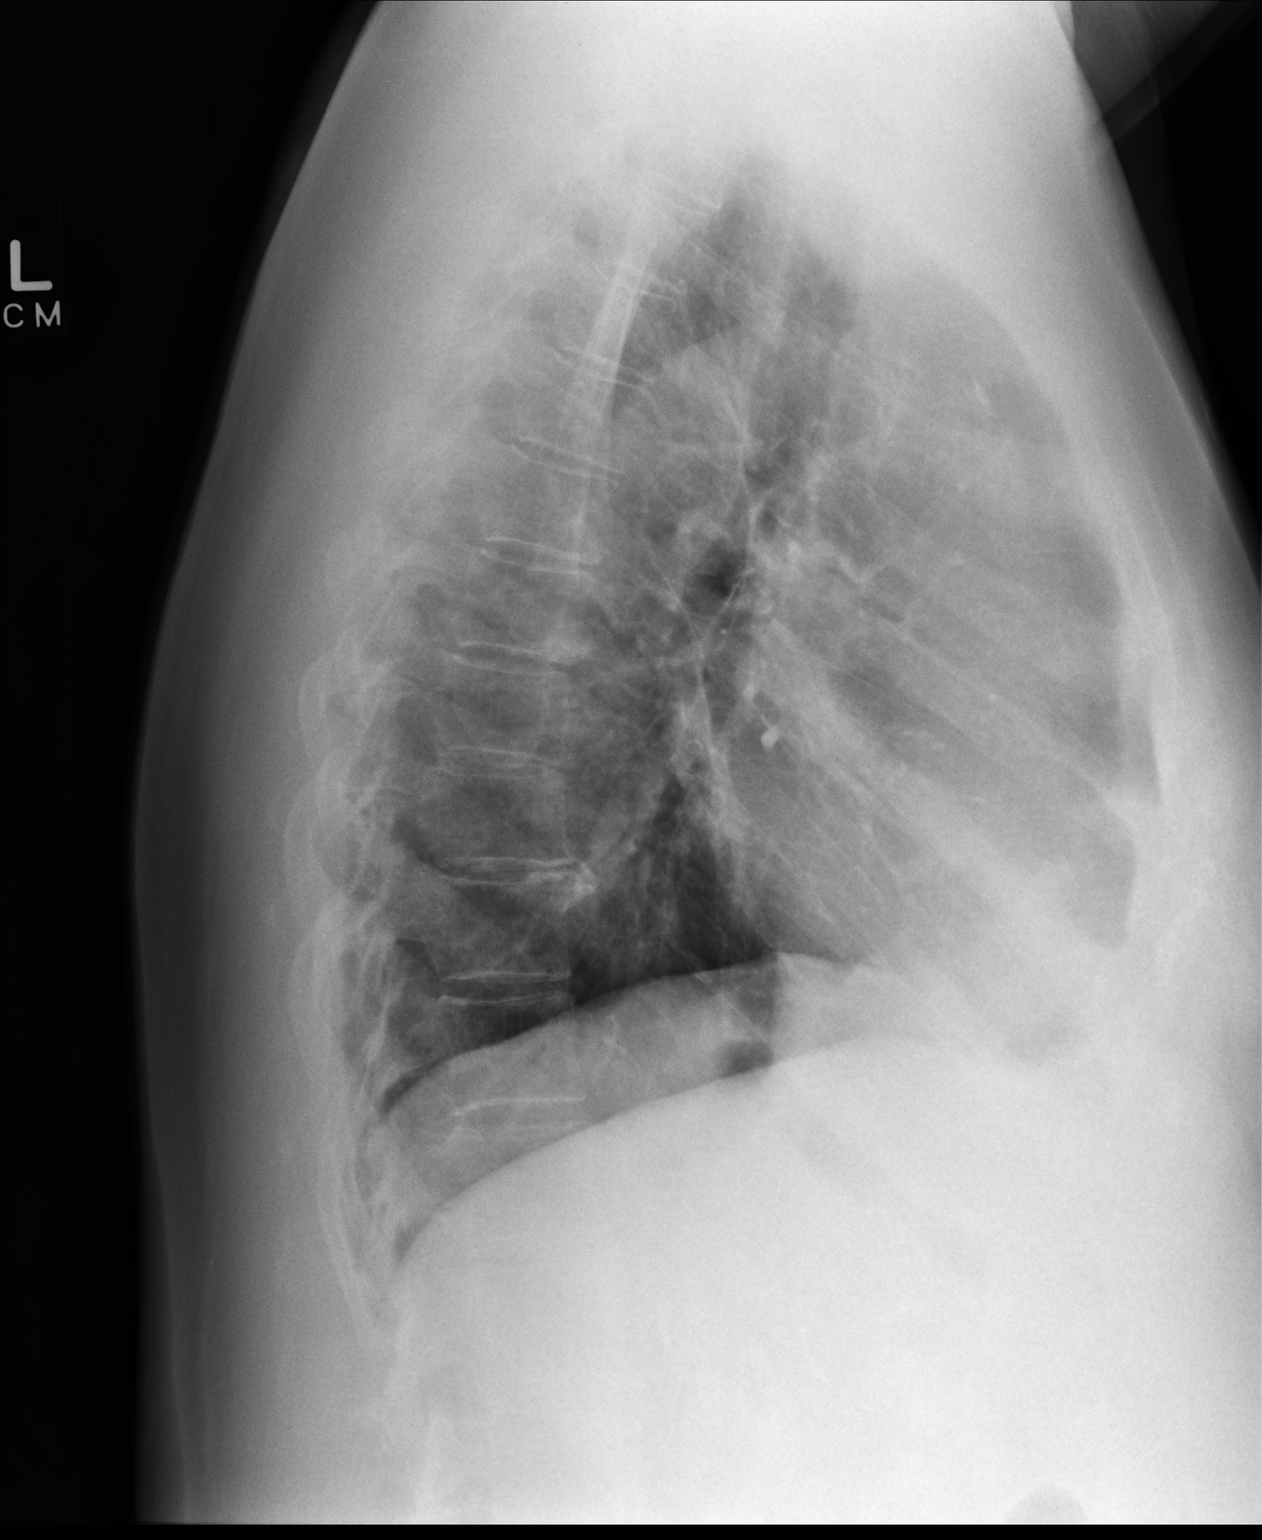

[2 of 2 positions shown; findings below may reference images not displayed]

FINDINGS: Lung fields are clear. Heart and mediastinal structures are 
unremarkable. Osseous structures are intact.
IMPRESSION: 1. No acute process.

## 2019-11-04 IMAGING — MR MRI LUMBAR SPINE WITHOUT CONTRAST
4 of 8 series · 14 of 48 positions shown · IV contrast (gadolinium)
Comparison: None

******** ADDENDUM #1 ********/n 
Limited views of the L5-S1 disc space from a prior outside MRI November 06, 2014 
show a similar degree of left-sided disc extrusion. Evaluation and comparison is 
limited. 
ORIGINAL REPORT ******** 
MRI LUMBAR SPINE WITHOUT CONTRAST, 11/04/2019 [DATE]: 
CLINICAL INDICATION: Left leg and buttock pain
TECHNIQUE: Sagittal T1, Sagittal T2, Sagittal STIR, Axial T1 and Axial T2 MR 
images of the lumbar spine were performed without intravenous gadolinium 
enhancement.

[Series 101: survey · axial · 10.0mm · 1.39mm/px · z∈[-15,+199]mm · 2 of 9 slices shown]
[im 1/9]
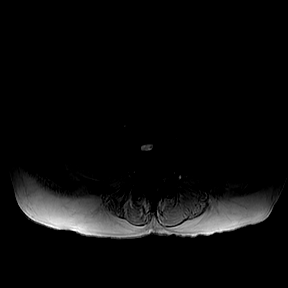
[im 9/9]
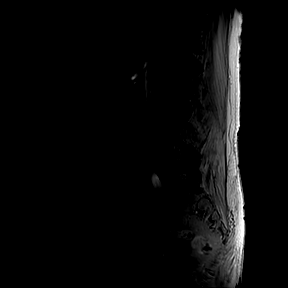

[Series 201: t2w_cor-surv · coronal · 6.0mm · 0.50mm/px · 1 of 5 slices shown]
[im 1/5]
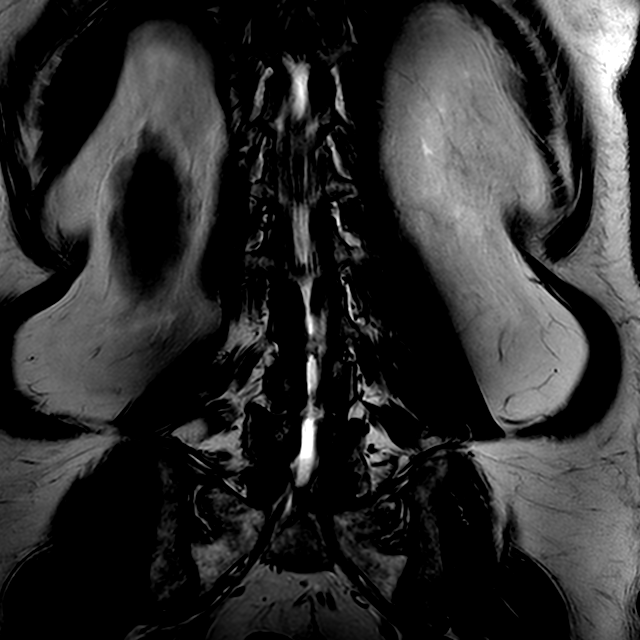

[Series 301: t2w_tse sag · sagittal · 4.0mm · 0.33mm/px · 3 of 17 slices shown]
[im 1/17]
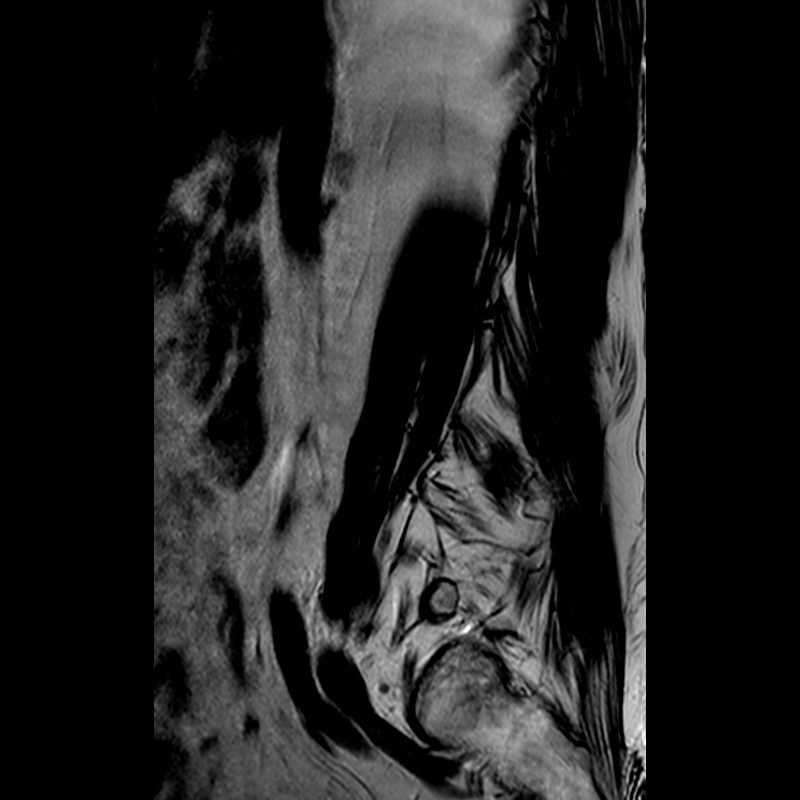
[im 9/17]
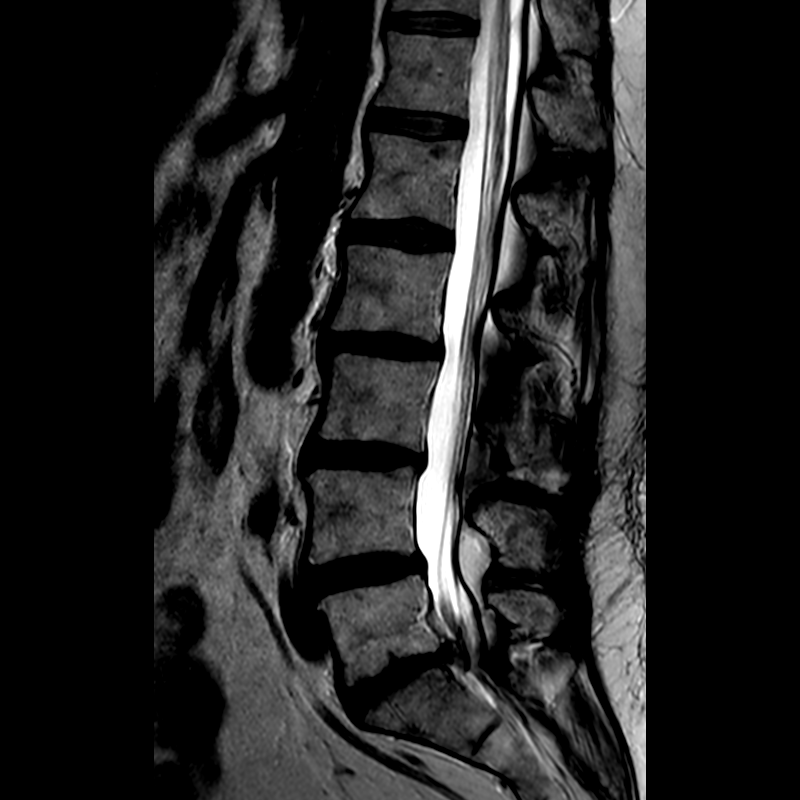
[im 17/17]
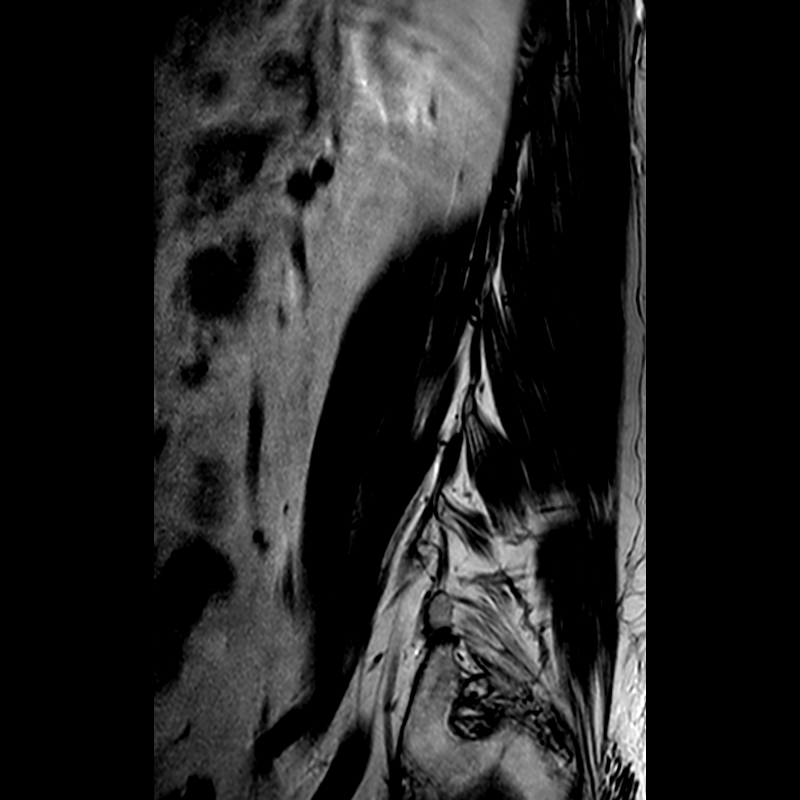

[Series 701: T1 · axial · 4.0mm · 0.35mm/px · z∈[-93,+100]mm · 8 of 35 slices shown]
[im 1/35]
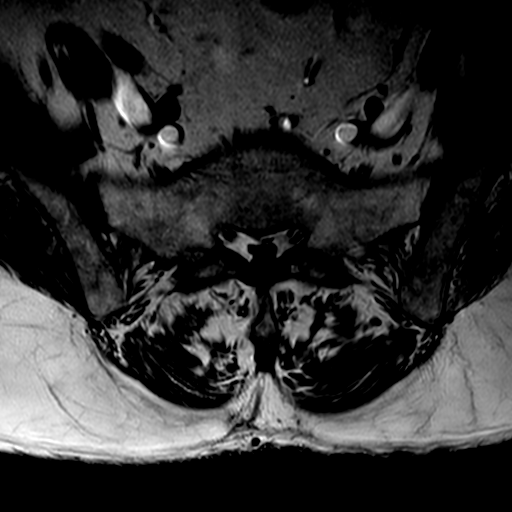
[im 4/35]
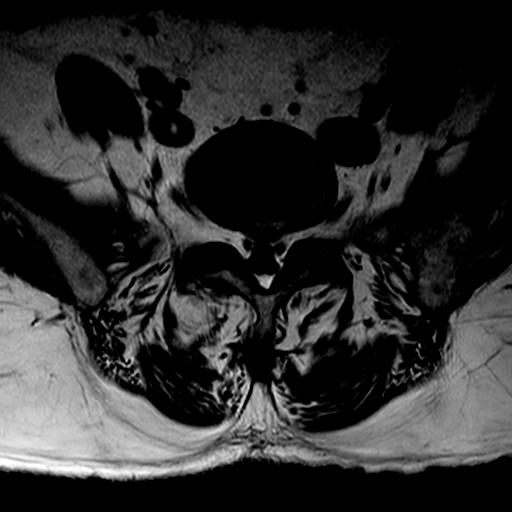
[im 12/35]
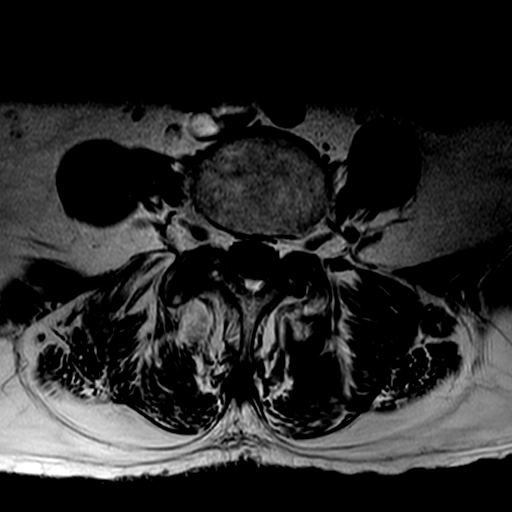
[im 16/35]
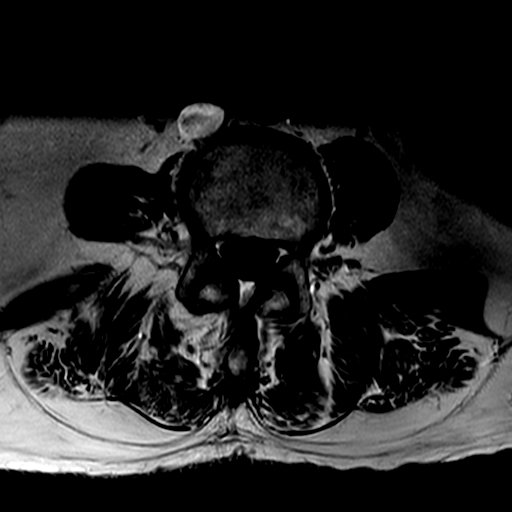
[im 19/35]
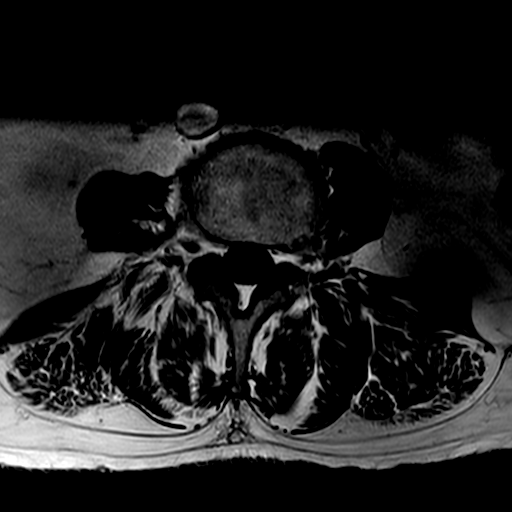
[im 23/35]
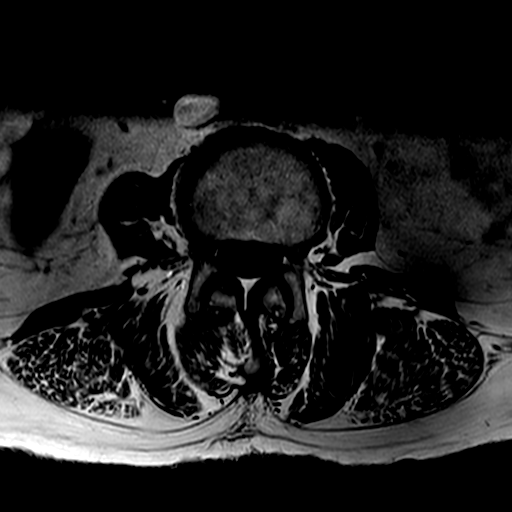
[im 31/35]
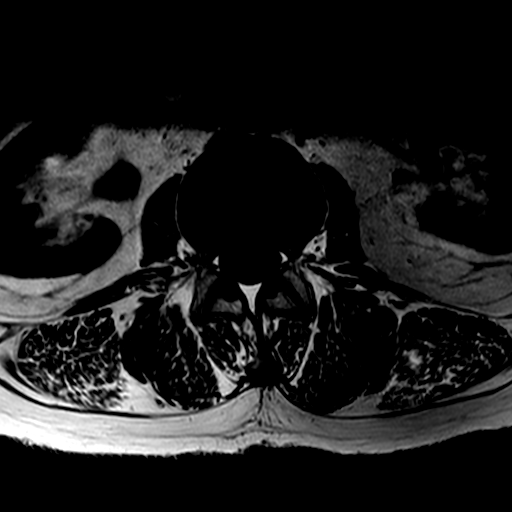
[im 35/35]
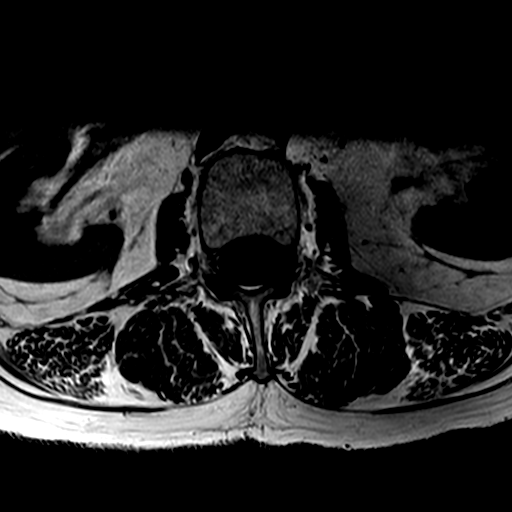

[14 of 48 positions shown; findings below may reference images not displayed]

FINDINGS: Lumbar vertebral heights are intact. There is no evidence for fracture 
or spinal malignancy. The conus is normal.  Visualized sacrum is intact. 
At L5-S1 there is a large midline and left-sided disc extrusion, impinging the 
left S1 root sleeve. Extruded disc material measures 14 mm coronal plane, 6 mm 
AP dimension, axial T2 image 2. There is moderate to marked narrowing at L5-S1. 
There are mild to moderate degenerative endplate changes at L5-S1. 
At L4-5 there is slight anterolisthesis and mild disc bulge. There are moderate 
marked facet changes and moderate dorsal epidural lipomatosis contributing to 
mild-to-moderate canal stenosis, axial T2 image 7. Foramina are open. 
At L3-4 there is mild right-sided canal stenosis due to disc bulge and moderate 
facet and ligamentous hypertrophy. There is mild right foraminal narrowing. 
At L2-3 there is 2 mm anterolisthesis. There is borderline to mild canal 
stenosis. Foramina are open. 
At L1-2 the canal and foramina are open.
IMPRESSION: Large left paracentral disc extrusion at L5-S1 and impinges the left S1 nerve 
root. 
Mild to moderate canal stenosis at L4-5. 
No evidence for fracture or malignancy. 
Mild lumbar levocurvature could be related to spasm or scoliosis.

## 2020-08-24 NOTE — Progress Notes (Signed)
Lisa Hampton    Date of Visit:  01/28/2016  Date of Birth: 1955-06-26  Age: 65 yrs.   Medical Record Number: [id number]  __  CURRENT DIAGNOSES     1. Long term (current) use of anticoagulants,  Z79.01  2. Chronic atrial fibrillation, I48.2  3. CVA, I67.89  4. Murmur, R01.1  __  ALLERGIES     No Known Drug Allergies  __  MEDICATIONS     1. Aspir-81 81 mg tablet,delayed release, 1 po  qd  2. atorvastatin 40 mg tablet, 1 po QD  3. Cardizem CD 180 mg capsule,extended release, 1 po q am  4. Coumadin 3 mg tablet, 1 po qd  5. Fish Oil 300 mg capsule, 2000mg  1 po qd  6. lisinopril 20 mg tablet, 1 po q pm  7. metformin  ER 500 mg tablet,extended release 24 hr, 1 po QD  8. trazodone 50 mg tablet, 1/2 tab po QHS  9. Tylenol 325 mg tablet, PRN  10. Vitamin C 1,000 mg tablet, 1 po QD  11. Vitamin D3 1,000 unit tablet, 1 po QD  12. warfarin 4 mg tablet,  1 po q pm  __  CHIEF COMPLAINT/REASON FOR VISIT  Followup of Chronic atrial fibrillation  __   HISTORY OF PRESENT ILLNESS  Lisa Hampton returns for followup of her chronic atrial fibrillation. In the past year, she has felt very well, having no shortness of breath, fatigue, chest discomfort, or  lightheadedness despite being active.     __  PAST HISTORY     Past Medical Illnesses : Diabetes mellitus-adult onset, Hyperlipidemia, Hypertension;  Past Cardiac Illnesses: Atrial fibrillation, Atrial flutter, CVA;  Infectious Diseases: Usual childhood illnesses of mumps, measles and chickenpox; Surgical Procedures : Tonsillectomy 1993, Hysterectomy 1998; Trauma History: January 2015 fall with L leg hematoma; Cardiology  Procedures-Noninvasive: Treadmill Stress Test, Echocardiogram, Holter Monitor  ___  FAMILY HISTORY   Mother -- Hypertension    __  CARDIAC RISK FACTORS     Tobacco Abuse: used to smoke,  but quit; Family History of Heart Disease: positive; Hyperlipidemia : positive; Hypertension: positive;  Diabetes Mellitus : positive; Prior History of Heart Disease:  negative; Obesity : positive; Sedentary Life Style:positive; FAO:[id number];  Menopausal:positive, surgical menopause  __  SOCIAL HISTORY     Alcohol Use: drinks occasionally; Smoking: Does not smoke; Never smoker ([id number]);  Diet: Low carbs,low sugar; Exercise: No regular exercise;   __   REVIEW OF SYSTEMS    General: Denies recent weight loss, weight gain, fever or chills or change  in exercise tolerance.; Integumentary: Denies any change in hair or nails, rashes, or skin lesions.;  Eyes: wears eye glasses/contact lenses; Ears, Nose, Throat, Mouth: Denies any hearing loss, epistaxis,  hoarseness or difficulty speaking.;Respiratory: Denies dyspnea, cough, wheezing or hemoptysis.; Cardiovascular : see HPI; Abdominal : Denies ulcer disease, hematochezia or melena.;Musculoskeletal :large resolving L leg hematoma; Neurological : previous cerebral vascular accident; Psychiatric : anxiety; Endocrine: Denies any history of weight change, heat/cold intolerance, polydipsia, or polyuria;  Hematologic/Immunologic: Denies any food allergies, seasonal allergies, bleeding disorders.  __  PHYSICAL EXAMINATION     Vital Signs:  Blood Pressure:        Constitutional:  Cooperative, alert and oriented,well developed, well nourished, in no acute distress. Skin: warm and dry to touch, left leg hematoma thigh to ankle slowly  resolving Head: Normocephalic, normal hair pattern, no masses or tenderness Eyes : conjunctivae and lids unremarkable ENT: Ears, Nose and throat reveal no gross abnormalities  Neck : JVP normal, no carotid bruit, thyroid not enlarged Chest: clear to auscultation bilaterally, clear to auscultation and percussion, Normal symmetry  Cardiac: Irregularly irrregular rhythm, normal S1 and S2, grade 2/6 early peaking systolic ejection murmur at base, no rubs or gallops Abdomen : abdomen unremarkable Peripheral Pulses: radial pulse(s) 2+ Extremities/Back : No deformities, clubbing, cyanosis, erythema or edema observed. There  are no spinal abnormalities noted. Normal muscle strength and tone. Neurological : No gross motor or sensory deficits noted, affect appropriate, oriented to time, person and place.   __      IMPRESSIONS:   1. Chronic  atrial fibrillation with controlled ventricular rates on Cardizem CD.  2. Prior brainstem stroke when not on anticoagulation but none since being on Coumadin.   3. Hypertension.   4. Diabetes mellitus.   5. Hyperlipidemia.     RECOMMENDATION:    Since she is doing well, I will make no change in her medication regimen and she will see my nurse practitioner in followup in one year.     Walter L. Trish Mage, MD      cc:   Lana Fish  MD      Carolan Shiver, MD, [facility]

## 2020-08-24 NOTE — Progress Notes (Signed)
[facility] OFFICE  [address] [place], [place] [address]     Lisa Hampton    Date of Visit:  06/02/2014  Date of Birth: Sep 19, 1955  Age: 65 yrs.   Medical Record Number: [id number]  __  CURRENT DIAGNOSES     1. Chronic atrial fibrillation, I48.2   2. CVA, I67.89  3. Long term (current) use of anticoagulants, Z79.01  __  ALLERGIES    No  Known Drug Allergies  __  MEDICATIONS     1. warfarin 4 mg tablet, 1 po q pm  2. Aspir-81  81 mg tablet,delayed release, 1 po qd  3. Vitamin C 500 mg tablet, 2 po qd  4. lisinopril 20 mg tablet, 1 po q pm  5. Cardizem CD 180 mg capsule,extended release, 1 po q am  6. Crestor 40 mg tablet, 1 po qd  7. Fish Oil 300 mg capsule,  2000mg  1 po qd  8. Coumadin 3 mg tablet, 1 po qd  __  CHIEF COMPLAINT/REASON FOR VISIT  hospital f/u, hematoma  __   HISTORY OF PRESENT ILLNESS  Lisa Hampton comes into the office for hospital followup. She is on chronic Coumadin therapy for chronic atrial fibrillation and had an acute stroke when she was off Coumadin.  She unfortunately fell down a few steps and was admitted to the hospital with a rather large significant hematoma of her left lower extremity on January 5. She required fresh frozen plasma; no blood transfusion. Her INR at that time was 3. She just restarted  Coumadin two days ago by her primary care physician, who manages her Coumadin. Her INR today is 1.3. She has not been really well-controlled before. Her INRs were somewhat sporadic. For now, we are going to give her 3 mg once a day and recheck in five  days. She still has a significant amount of pain in her leg, but it is slowly improving. She is unable to take heavy narcotics due to side effects.     __  PAST HISTORY     Past Medical Illnesses: Diabetes mellitus-adult onset, Hyperlipidemia, Hypertension;   Past Cardiac Illnesses: Atrial fibrillation, Atrial flutter, CVA; Infectious Diseases: Usual childhood  illnesses of mumps, measles and chickenpox; Surgical Procedures:  Tonsillectomy 1993, Hysterectomy 1998;  Trauma History: January 2015 fall with L leg hematoma; Cardiology Procedures-Noninvasive: Treadmill  Stress Test, Echocardiogram, Holter Monitor  ___  FAMILY HISTORY  Mother --  Hypertension    __  CARDIAC RISK FACTORS     Tobacco Abuse: used to smoke, but quit;  Family History of Heart Disease: positive; Hyperlipidemia: positive;  Hypertension: positive;  Diabetes Mellitus: positive;  Prior History of Heart Disease: negative; Obesity: positive;  Sedentary Life Style:positive; [id number]; Menopausal :positive, surgical menopause  __  SOCIAL HISTORY     Alcohol Use: drinks occasionally; Smoking: Does not smoke; Never smoker ([id number]);  Diet: Low carbs,low sugar; Exercise: No regular exercise;   __   REVIEW OF SYSTEMS    General: Denies recent weight loss, weight gain, fever or chills or change  in exercise tolerance.; Integumentary: Denies any change in hair or nails, rashes, or skin lesions.;  Eyes: wears eye glasses/contact lenses; Ears, Nose, Throat, Mouth: Denies any hearing loss, epistaxis,  hoarseness or difficulty speaking.;Respiratory: Denies dyspnea, cough, wheezing or hemoptysis.; Cardiovascular : palpitations, edema; Abdominal : Denies ulcer disease, hematochezia or melena.;Musculoskeletal :large resolving L leg hematoma; Neurological : previous cerebral vascular accident; Psychiatric : anxiety; Endocrine: Denies any history of weight change,  heat/cold intolerance, polydipsia, or polyuria;  Hematologic/Immunologic: Denies any food allergies, seasonal allergies, bleeding disorders.  __  PHYSICAL EXAMINATION     Vital Signs:  Blood Pressure:  112/76 Sitting, Left arm, regular cuff    Weight:  179.00 lbs.  Height: 70"  BMI: 25    Pulse: 104/min.       Constitutional: Cooperative, alert and oriented,well developed, well  nourished, in no acute distress. Skin: warm and dry to touch, left leg hematoma thigh to ankle slowly resolving  Head: Normocephalic, normal hair  pattern, no masses or tenderness Eyes: conjunctivae and lids unremarkable  ENT: Ears, Nose and throat reveal no gross abnormalities Neck: JVP normal, no carotid bruit, thyroid  not enlarged Chest: clear to auscultation bilaterally, clear to auscultation and percussion, Normal symmetry  Cardiac: Irregularly irregular rhythm with variable 1st heart sound, normal 2nd heart sound, Apical impulse not displaced, no murmurs, gallops or rubs detected  Abdomen: abdomen unremarkable Peripheral Pulses: radial pulse(s) 2+  Extremities/Back: No deformities, clubbing, cyanosis, erythema or edema observed. There are no spinal abnormalities noted. Normal muscle strength and tone.  Neurological: No gross motor or sensory deficits noted, affect appropriate, oriented to time, person and place.   __    Medications added today  by the physician:  Coumadin 3 mg tablet, 1 po qd, 30  Coumadin 3 mg tablet, 1 po qd, 30    IMPRESSIONS:  1. Status post trauma to her left lower leg resulting in a large hematoma while  on   Coumadin requiring reversal. INR 3 at the time; given fresh frozen plasma.   This is now slowly resolving.  2. Chronic Coumadin therapy for atrial fibrillation.  3. Chronic atrial fibrillation; rate controlled currently.  4. History  of brainstem stroke when not on anticoagulation, but none since being   on Coumadin.  5. Hypertension; controlled.  6. Diabetes.  7. Hyperlipidemia.    RECOMMENDATIONS:  1. We agree with restarting Coumadin 3 mg once a day.  She will check her INR in   about five days, and we will adjust to goal INR 2-3. She will continue to rest her   left lower extremity; elevation and light compression wraps. Advised not to take   additional Advil for swelling.  2. Return  office visit certainly if she has any further changes in her leg.  3. Follow up with Dr. Trish Mage for electrophysiology. She saw him in June, so she   should have a receipt office visit in June.  4. She can restart her baby aspirin.      Victorino Dike B. Vinod Mikesell, ANP    JBD/tudw    cc: Leida Lauth MD    JD  ____________________________   TODAYS ORDERS  PT/INR 3 days  PT/INR 5 days  Diet mgmt edu, guidance and counseling TODAY        Lisa Back, NP

## 2020-08-24 NOTE — Progress Notes (Signed)
[facility] OFFICE  [address] [address] [place], [place] [address]     Lisa Hampton    Date of Visit:  12/07/2014  Date of Birth: September 17, 1955  Age: 65 yrs.   Medical Record Number: [id number]  __  CURRENT DIAGNOSES     1. Chronic atrial fibrillation, I48.2   2. Long term (current) use of anticoagulants, Z79.01  3. CVA, I67.89  __  ALLERGIES    No  Known Drug Allergies  __  MEDICATIONS     1. warfarin 4 mg tablet, 1 po q pm  2. Aspir-81  81 mg tablet,delayed release, 1 po qd  3. lisinopril 20 mg tablet, 1 po q pm  4. Cardizem CD 180 mg capsule,extended release, 1 po q am  5. Fish Oil 300 mg capsule, 2000mg  1 po qd  6. Coumadin 3 mg tablet, 1 po qd  7. niacin 50 mg  tablet, 1 po qd  8. Tylenol 325 mg tablet, PRN  9. Wellbutrin XL 300 mg 24 hr tablet, extended release, 1 po QD  10. metformin ER 500 mg tablet,extended release 24 hr, 1 po QD  11. trazodone 50 mg tablet, 1/2 tab po QHS  12. Vitamin  C 1,000 mg tablet, 1 po QD  13. Vitamin D3 1,000 unit tablet, 1 po QD  14. atorvastatin 40 mg tablet, 1 po QD  __  CHIEF COMPLAINT/REASON FOR VISIT   Followup of Chronic atrial fibrillation  __  HISTORY OF PRESENT ILLNESS  Lisa Hampton returns for followup of her chronic atrial fibrillation. In  the past year, she has felt very well, having no shortness of breath, fatigue, chest discomfort, or lightheadedness despite being active. She is to undergo colonoscopy on August 15th and has been requested to have her INR reduced down to less than 1.5  for the procedure.   __  PAST HISTORY     Past Medical Illnesses : Diabetes mellitus-adult onset, Hyperlipidemia, Hypertension;  Past Cardiac Illnesses: Atrial fibrillation,  Atrial flutter, CVA; Infectious Diseases: Usual childhood illnesses of mumps, measles and chickenpox;  Surgical Procedures: Tonsillectomy 1993, Hysterectomy 1998; Trauma History: January 2015 fall with L leg  hematoma; Cardiology Procedures-Noninvasive: Treadmill Stress Test, Echocardiogram, Holter  Monitor   ___  FAMILY HISTORY  Mother -- Hypertension     __  CARDIAC RISK FACTORS     Tobacco Abuse: used to smoke, but quit;  Family History of Heart Disease: positive; Hyperlipidemia: positive;  Hypertension: positive;  Diabetes Mellitus: positive;  Prior History of Heart Disease: negative; Obesity: positive;  Sedentary Life Style:positive; SEG:BTDVVOHY; Menopausal :positive, surgical menopause  __  SOCIAL HISTORY    Alcohol Use : drinks occasionally; Smoking: Does not smoke; Never smoker ([id number]); Diet : Low carbs,low sugar; Exercise: No regular exercise;   __  REVIEW OF SYSTEMS     General: Denies recent weight loss, weight gain, fever or chills or change in exercise tolerance.; Integumentary : Denies any change in hair or nails, rashes, or skin lesions.; Eyes: wears eye glasses/contact lenses;  Ears, Nose, Throat, Mouth: Denies any hearing loss, epistaxis, hoarseness or difficulty speaking.;Respiratory : Denies dyspnea, cough, wheezing or hemoptysis.; Cardiovascular:  palpitations, edema; Abdominal : Denies ulcer disease, hematochezia or melena.; Musculoskeletal:large resolving L leg hematoma; Neurological  : previous cerebral vascular accident; Psychiatric:  anxiety; Endocrine: Denies any history of weight change, heat/cold intolerance, polydipsia, or polyuria;  Hematologic/Immunologic: Denies any food allergies, seasonal allergies, bleeding disorders.  __   PHYSICAL EXAMINATION    Vital Signs:  Blood  Pressure:   137/90 Sitting, Right arm, large cuff    Weight: 177.00 lbs.  Height:  70"  BMI: 25   Pulse: 76/min. Apical Irregular        Constitutional: Cooperative, alert and oriented,well developed, well nourished, in no acute distress. Skin:  warm and dry to touch, left leg hematoma thigh to ankle slowly resolving Head: Normocephalic, normal  hair pattern, no masses or tenderness Eyes: conjunctivae and lids unremarkable ENT : Ears, Nose and throat reveal no gross abnormalities Neck:  JVP normal, no carotid  bruit, thyroid not enlarged Chest: clear to auscultation bilaterally, clear to auscultation and percussion, Normal symmetry  Cardiac: Irregularly irregular rhythm with variable 1st heart sound, normal 2nd heart sound, Apical impulse not displaced, no murmurs, gallops or rubs detected  Abdomen: abdomen unremarkable Peripheral Pulses: radial  pulse(s) 2+ Extremities/Back: No deformities, clubbing, cyanosis, erythema or edema observed. There are no spinal abnormalities noted. Normal muscle strength  and tone. Neurological: No gross motor or sensory deficits noted, affect appropriate, oriented to time,  person and place.   __    Medications added today by the physician:    IMPRESSIONS:    1. Chronic atrial fibrillation with controlled ventricular rates on Cardizem CD.  2. Prior brainstem stroke when not on anticoagulation but none since being on Coumadin.   3. Hypertension.   4. Diabetes mellitus.   5. Hyperlipidemia.      RECOMMENDATION:   In order to help bring her INR down to the 1.5 level before her colonoscopy, I will have her work with one of our nurses to adjust the dose starting one week prior to the colonoscopy.  Otherwise, I will make no change in her medication regimen and she will see my nurse practitioner in followup in one year.     Walter L. Trish Mage, MD    WLA/tutlc     cc: Leida Lauth MD    EL   ____________________________   Christianne Dolin  ZO:[id number] Education ICD-10: I19.2 MedlinePlus Connect results for ICD-10 I48.2  PT/INR 5 days  RETURN VISIT EP WITH NP/PA 1 year  12 Lead ECG Today

## 2021-10-28 IMAGING — MG MAMMOGRAPHY SCREENING BILATERAL 3[PERSON_NAME]
8 series · 9 of 24 positions shown · non-contrast
Comparison: Prior exams were performed at [facility] and are currently 
unavailable for review.

________________________________________________________________________________________________ 
MAMMOGRAPHY SCREENING BILATERAL 3NOMASIBULELE MOATSHE, 10/28/2021 [DATE]: 
CLINICAL INDICATION: Encounter for screening mammogram.
TECHNIQUE: Digital bilateral mammograms and 3-D Tomosynthesis were obtained. 
These were interpreted both primarily and with the aid of computer-aided 
detection system.  
BREAST DENSITY: (Level B) There are scattered areas of fibroglandular density.

[L CC]
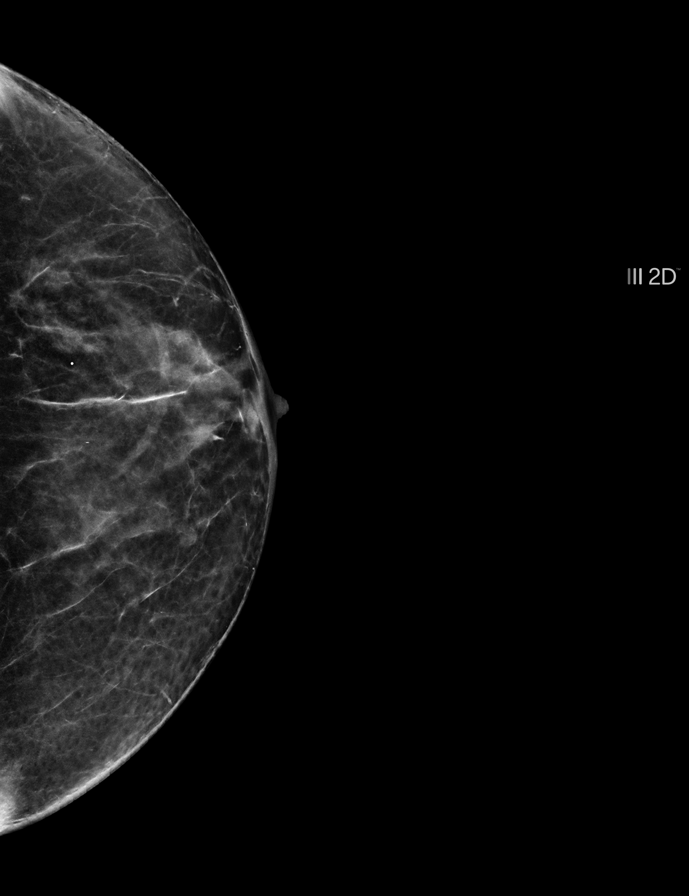

[R MLO]
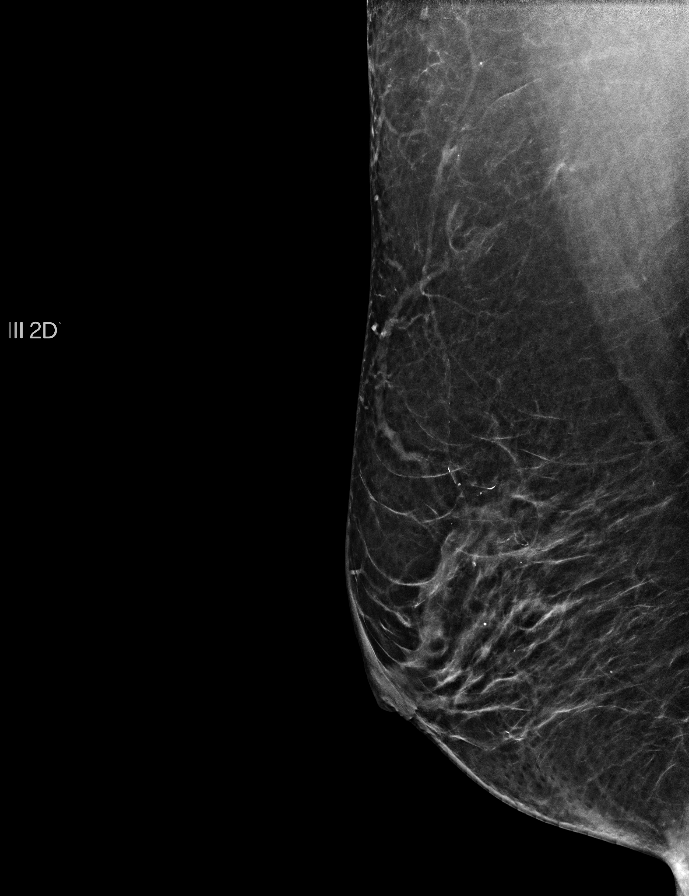

[R CC]
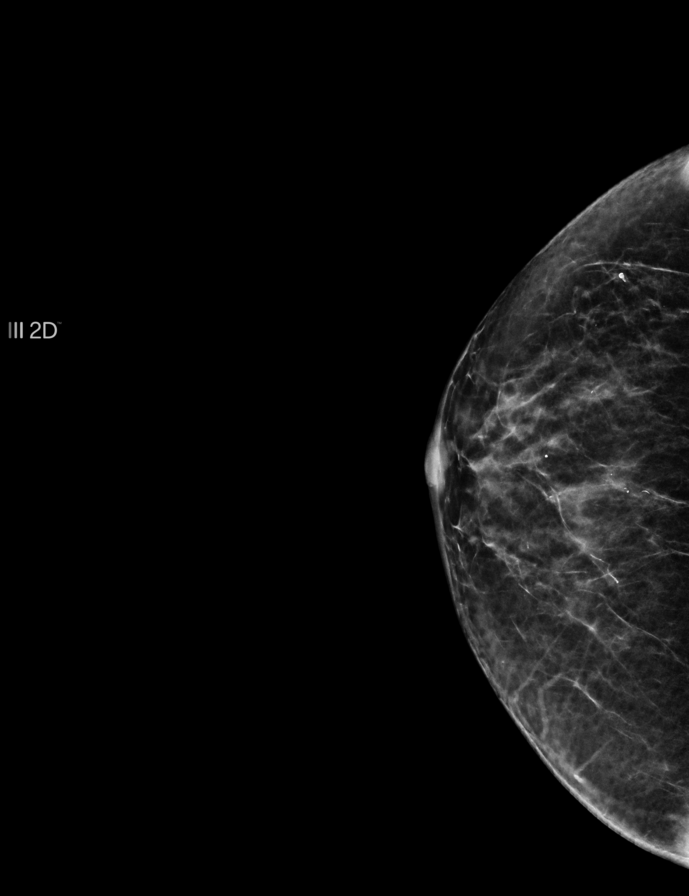

[L MLO]
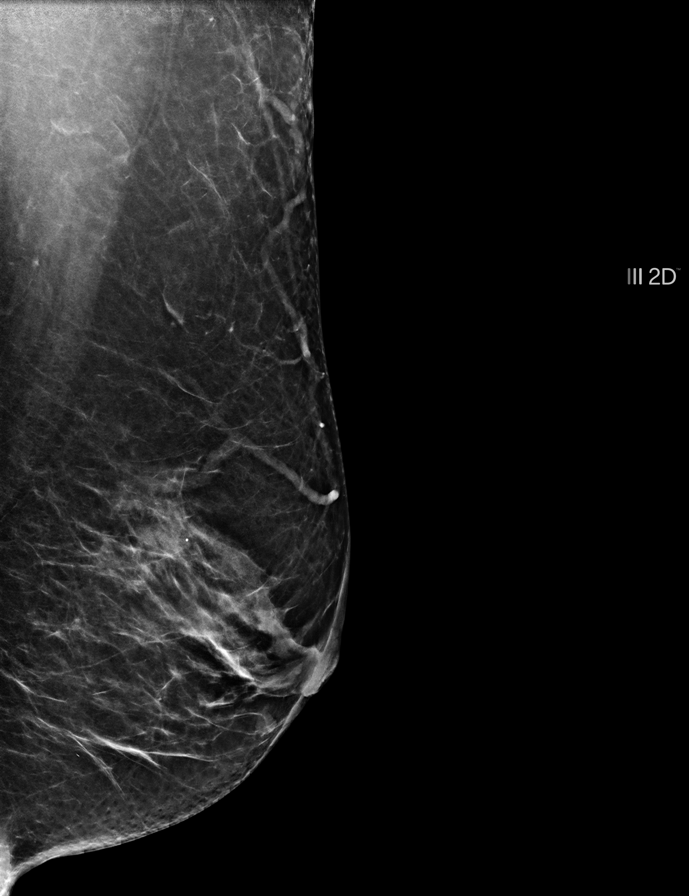

[L MLO tomo · 2 of 63 frames shown]
[frame 21/63]
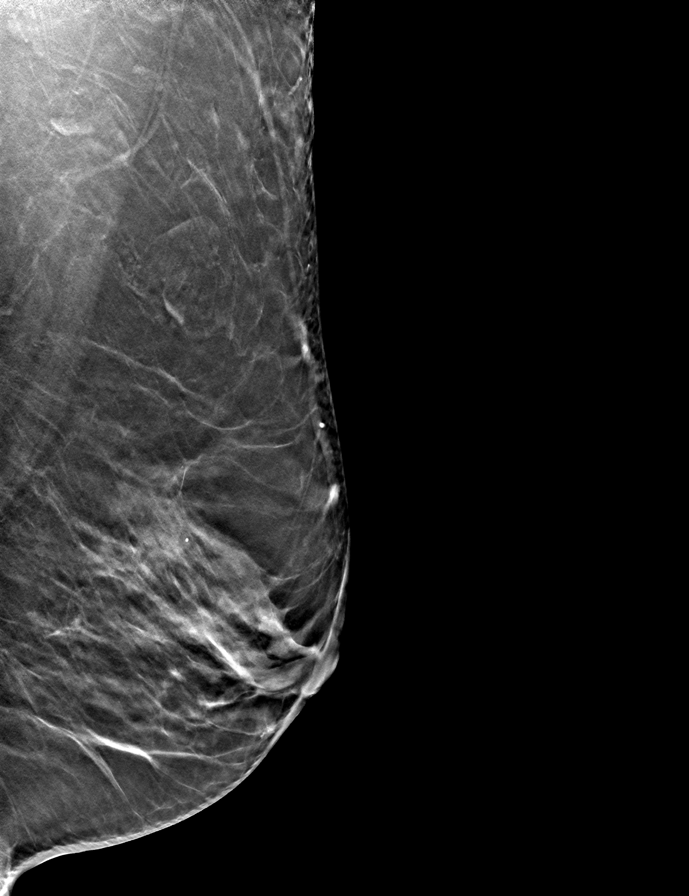
[frame 32/63]
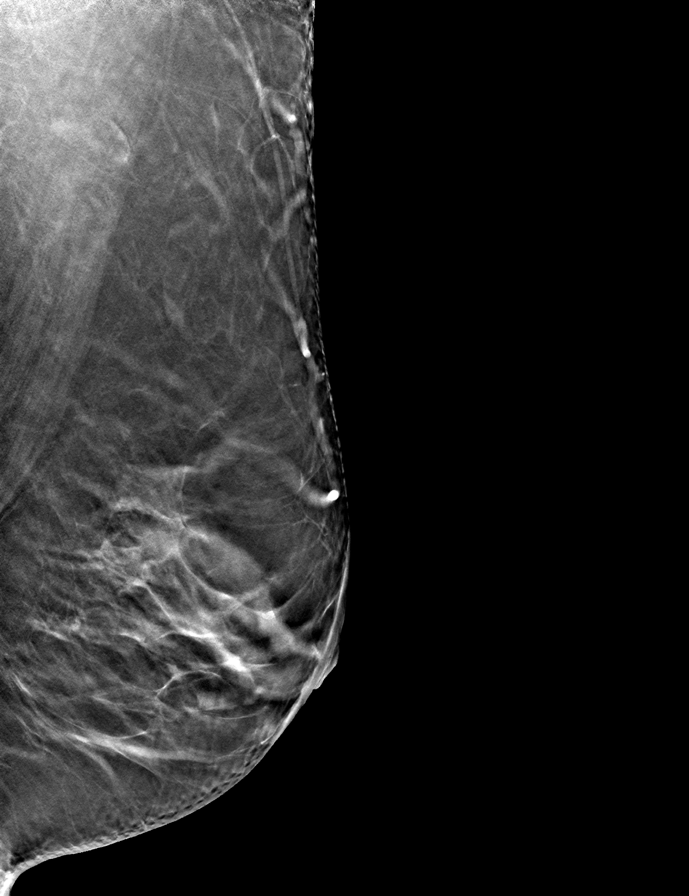

[L CC tomo · tomo slice 24/47.0]
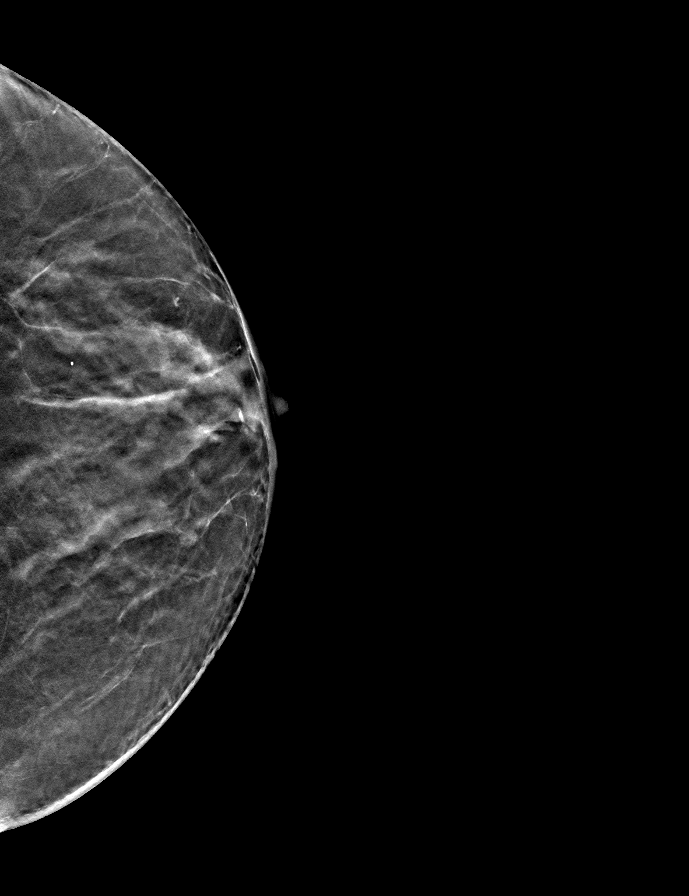

[R CC tomo · tomo slice 21/41.0]
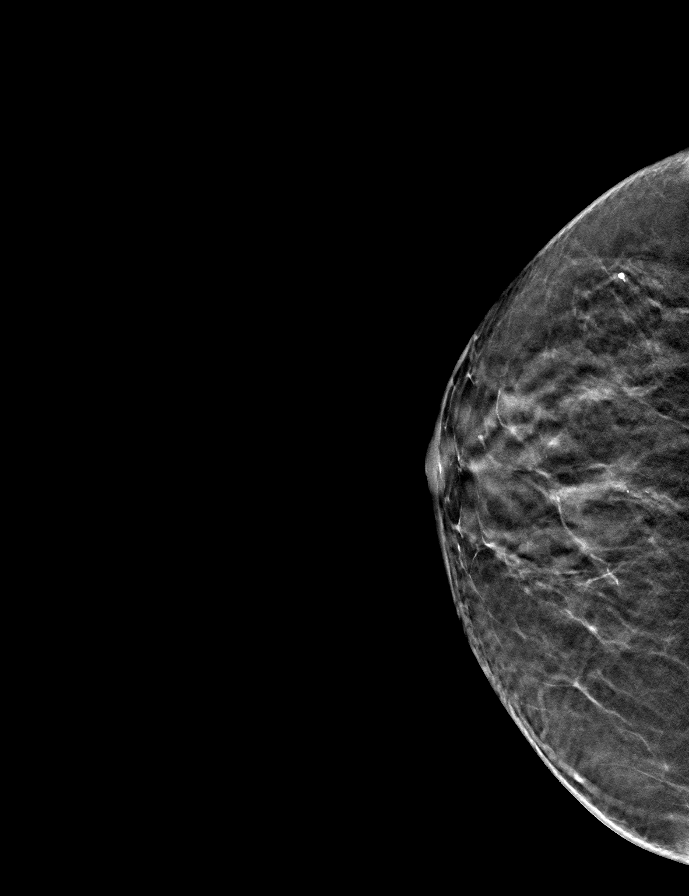

[R MLO tomo · tomo slice 31/62.0]
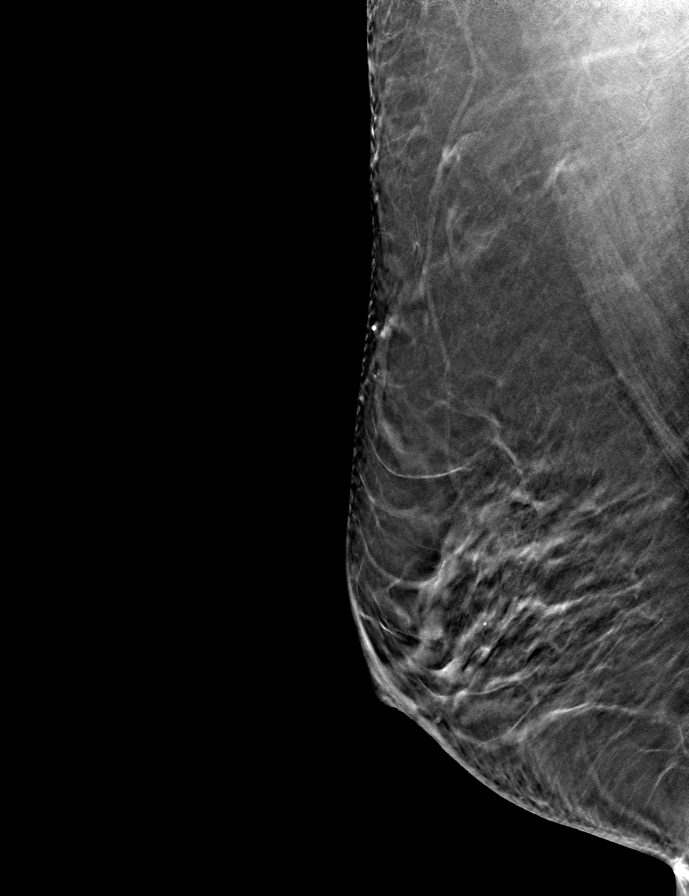

[9 of 24 positions shown; findings below may reference images not displayed]

FINDINGS: No suspicious mass, calcifications, or area of architectural 
distortion in either breast.
IMPRESSION: No mammographic findings suggestive for malignancy. 
(BI-RADS 2) Benign findings. Routine mammographic follow-up is recommended.

## 2021-11-30 IMAGING — CT CT BRAIN W/WO CONTRAST
3 of 6 series · 15 of 47 positions shown, 18 images · IV contrast (isovue)
Comparison: None.

________________________________________________________________________________________________ 
CT BRAIN W/WO CONTRAST, 11/30/2021 [DATE]: 
CLINICAL INDICATION: Blurry vision 1-2 times per day.. 
A search for DICOM formatted images was conducted for prior CT imaging studies 
completed at a non-affiliated media free facility.
TECHNIQUE: The head was scanned from vertex through skull base without and with 
100 mL of Isovue 300 injected intravenously on a high resolution CT scanner 
using dose reduction techniques. MPR reconstructions were performed.

[Series 2: brain wo 3.0 j30s 1 · axial · 0.43mm/px · z∈[-175,-31]mm · 10 of 58 slices shown, 13 images]
[im 5/58  brain]
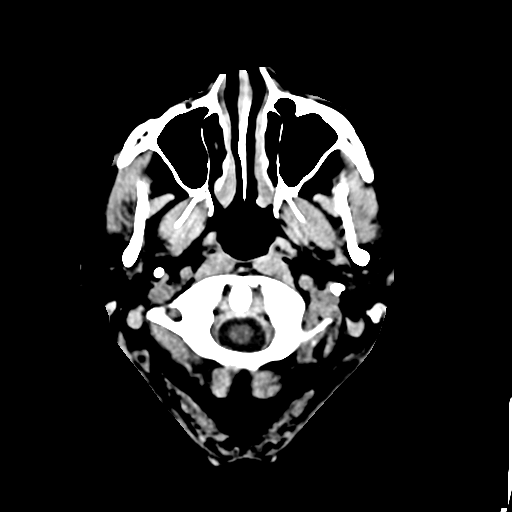
[im 5/58  bone]
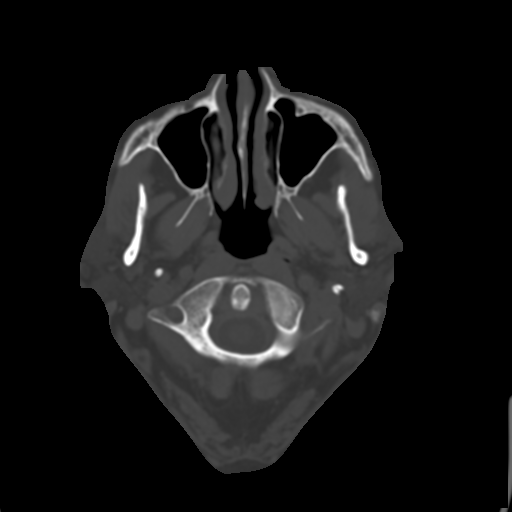
[im 9/58  brain]
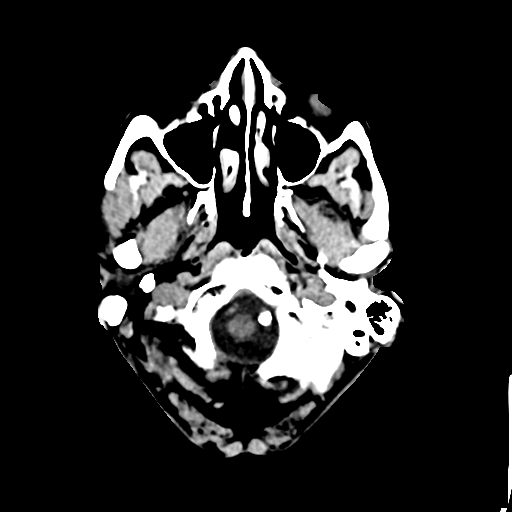
[im 17/58  brain]
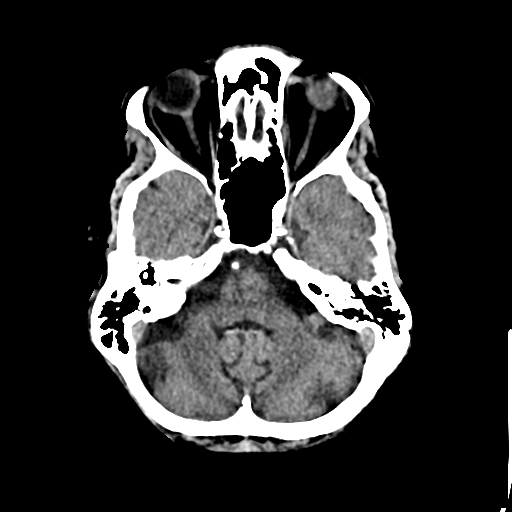
[im 21/58  brain]
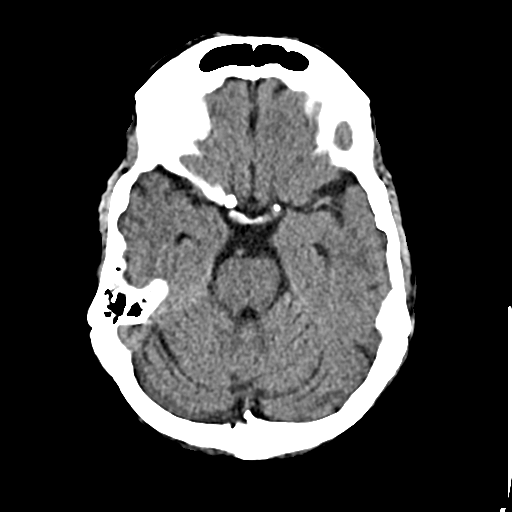
[im 25/58  brain]
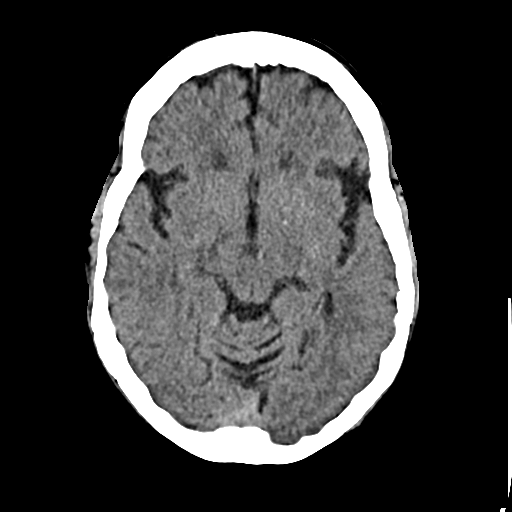
[im 25/58  bone]
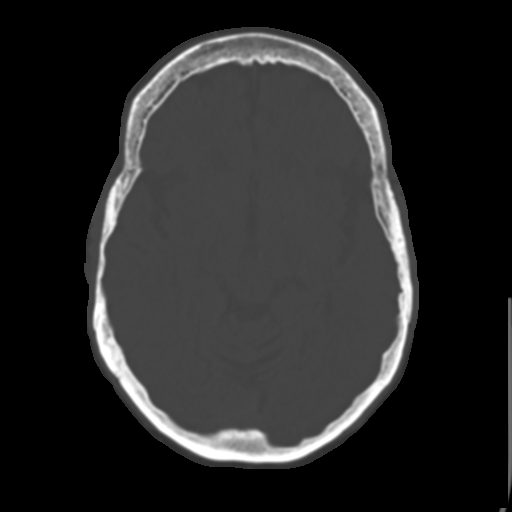
[im 33/58  brain]
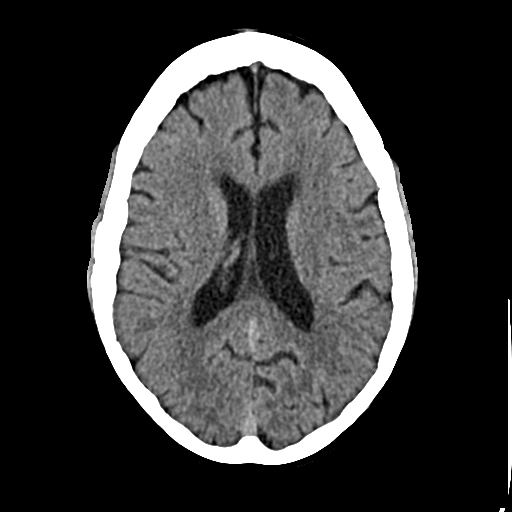
[im 37/58  brain]
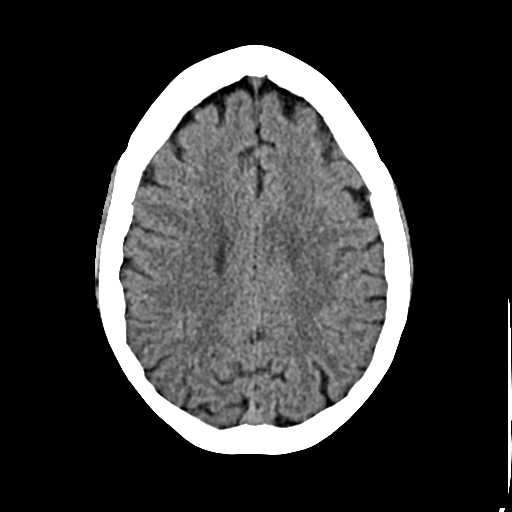
[im 41/58  brain]
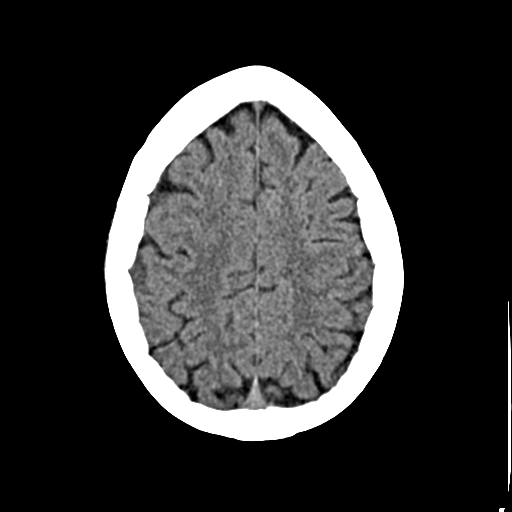
[im 49/58  brain]
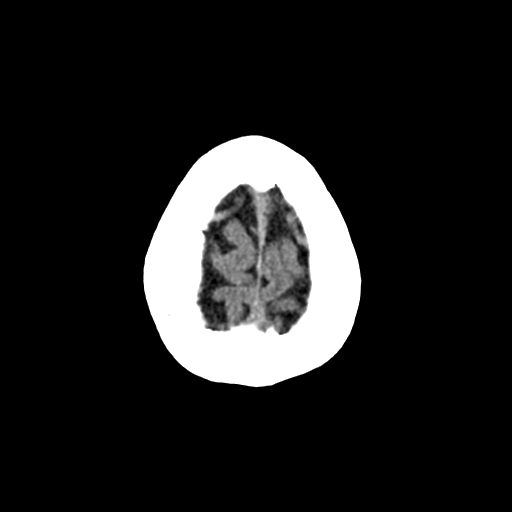
[im 49/58  bone]
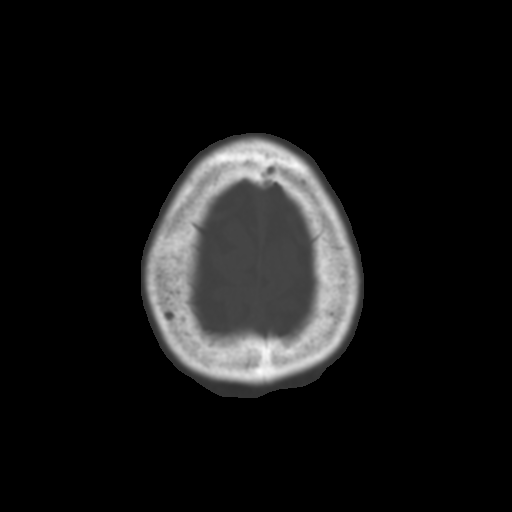
[im 53/58  brain]
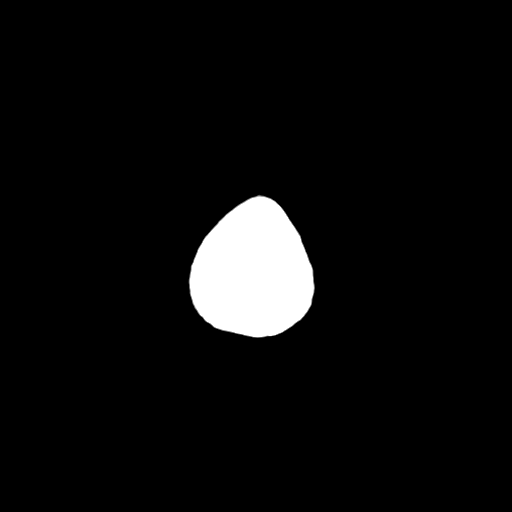

[Series 4: coronal · coronal · 0.32mm/px · 3 of 67 slices shown]
[im 17/67  brain]
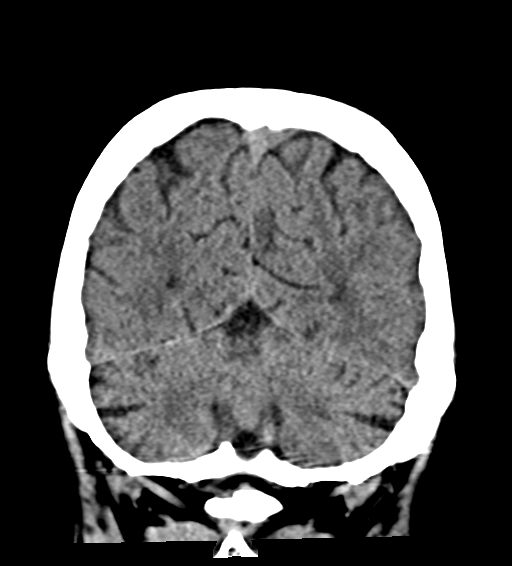
[im 34/67  brain]
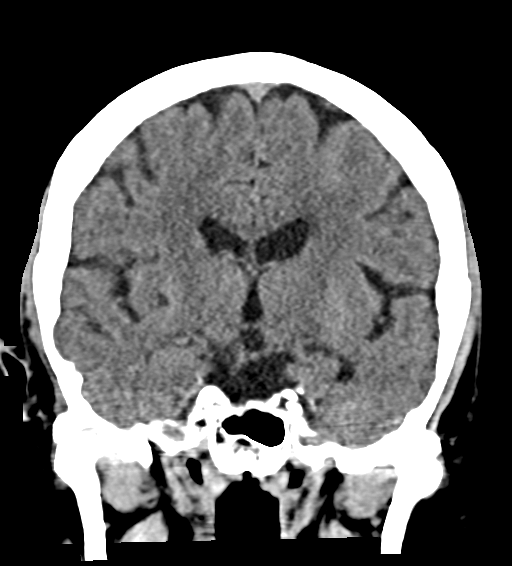
[im 50/67  brain]
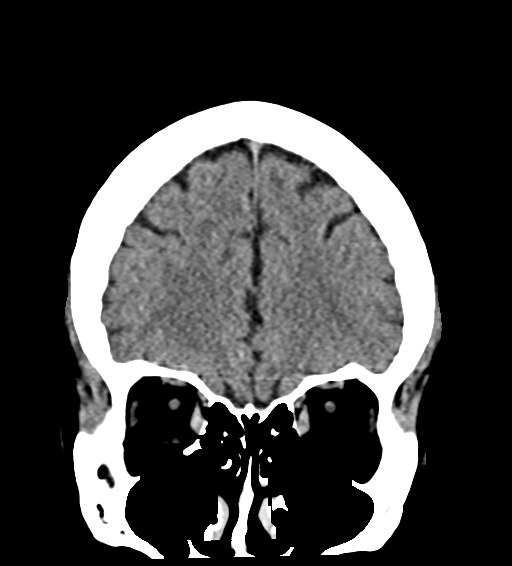

[Series 5: sagittal · sagittal · 0.35mm/px · 2 of 55 slices shown]
[im 19/55  brain]
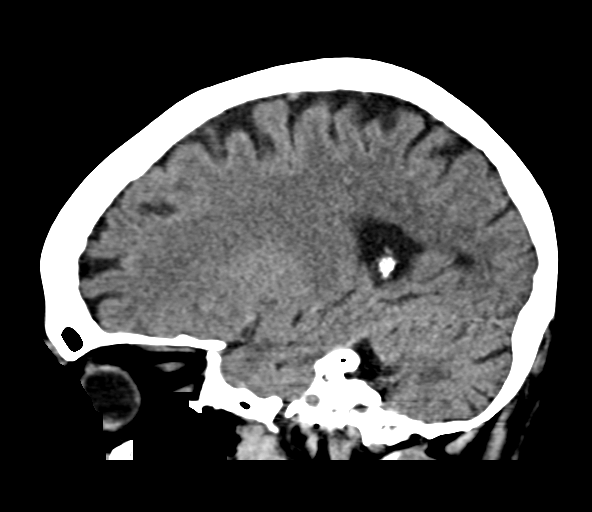
[im 37/55  brain]
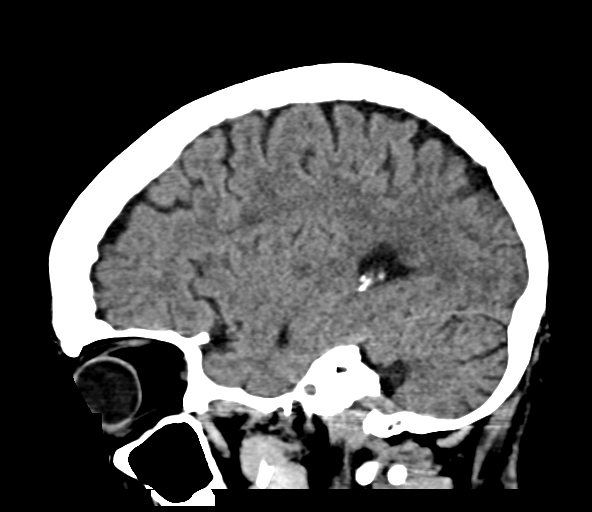

[15 of 47 positions shown; findings below may reference images not displayed]

FINDINGS: --------------------------------------------------------------------      
INTRACRANIAL: 
Brainstem, cerebellum, deep gray nuclei demonstrate no acute abnormality.  No 
large acute territorial ischemia, please note MRI is more sensitive for subtle 
acute ischemic change.  No acute hemorrhage, midline shift, mass effect.  No 
pathologic enhancement. Periventricular and deep white matter change, probably 
secondary to microangiopathy. Intracranial vascular calcification. Chronic 
lacunar infarct along the left dorsal pons.  
-------------------------------------------------------------------- 
OTHER:  
ORBITS/SINUSES/T-BONES:  Status post bilateral cataract extraction. Visualized 
paranasal sinuses are clear. The mastoid air cells and middle ear cavities are 
clear. 
BONES/SOFT TISSUES: No acute abnormality. 
--------------------------------------------------------------------
IMPRESSION: 1.  Chronic lacunar infarct along the left dorsal pons. Advise confirmation with 
MRI brain. 
2.  No acute abnormality or pathologic enhancement. 
3.  White matter microangiopathic change. 
RADIATION DOSE REDUCTION: All CT scans are performed using radiation dose 
reduction techniques, when applicable.  Technical factors are evaluated and 
adjusted to ensure appropriate moderation of exposure.  Automated dose 
management technology is applied to adjust the radiation doses to minimize 
exposure while achieving diagnostic quality images.

## 2021-12-06 IMAGING — MR MRI LUMBAR SPINE W/WO CONTRAST
4 of 11 series · 8 of 48 positions shown · IV contrast (Gadolinium)
Comparison: MRI lumbar spine from November 04, 2019.

________________________________________________________________________________________________ 
MRI LUMBAR SPINE W/WO CONTRAST, 12/06/2021 [DATE]: 
CLINICAL INDICATION: Hemilaminectomy. Low back pain. Sciatica down left leg..
TECHNIQUE: Multiplanar, multiecho position MR images of the lumbar spine were 
performed without and with 7.5 mL of Gadavist were injected intravenously by 
hand. 0 mL of Gadavist were discarded. Patient was scanned on a 1.5T magnet.

[Series 201: t2w_cor-surv · coronal · 6.0mm · 0.61mm/px · 1 of 14 slices shown]
[im 1/14]
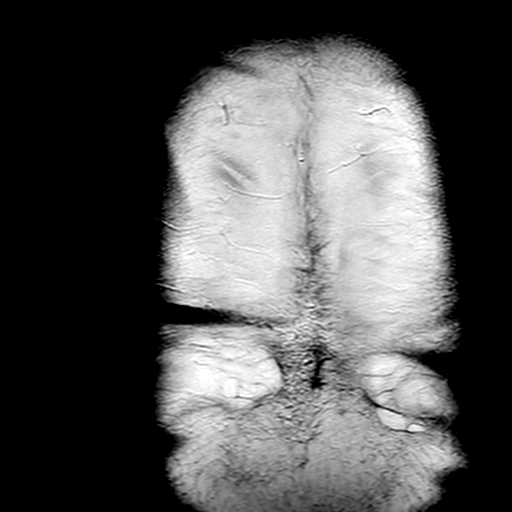

[Series 301: t1_tse_sag · sagittal · 4.0mm · 0.51mm/px · 2 of 19 slices shown]
[im 1/19]
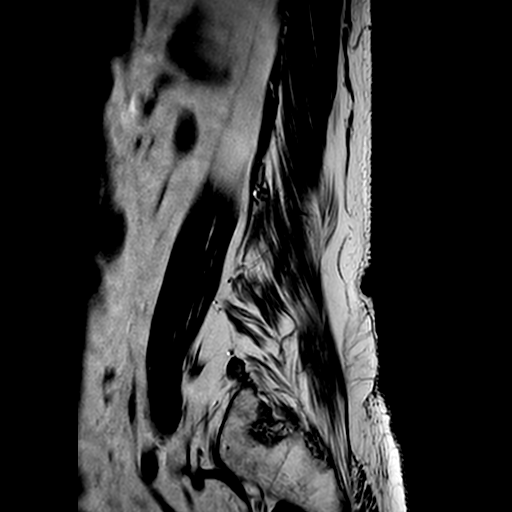
[im 19/19]
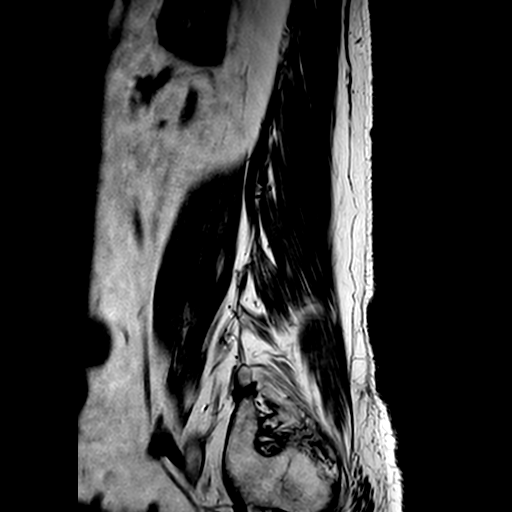

[Series 402: (id) view_ax mpr · axial · 1.0mm · 0.25mm/px · z∈[-63,+110]mm · 3 of 143 slices shown]
[im 21/143]
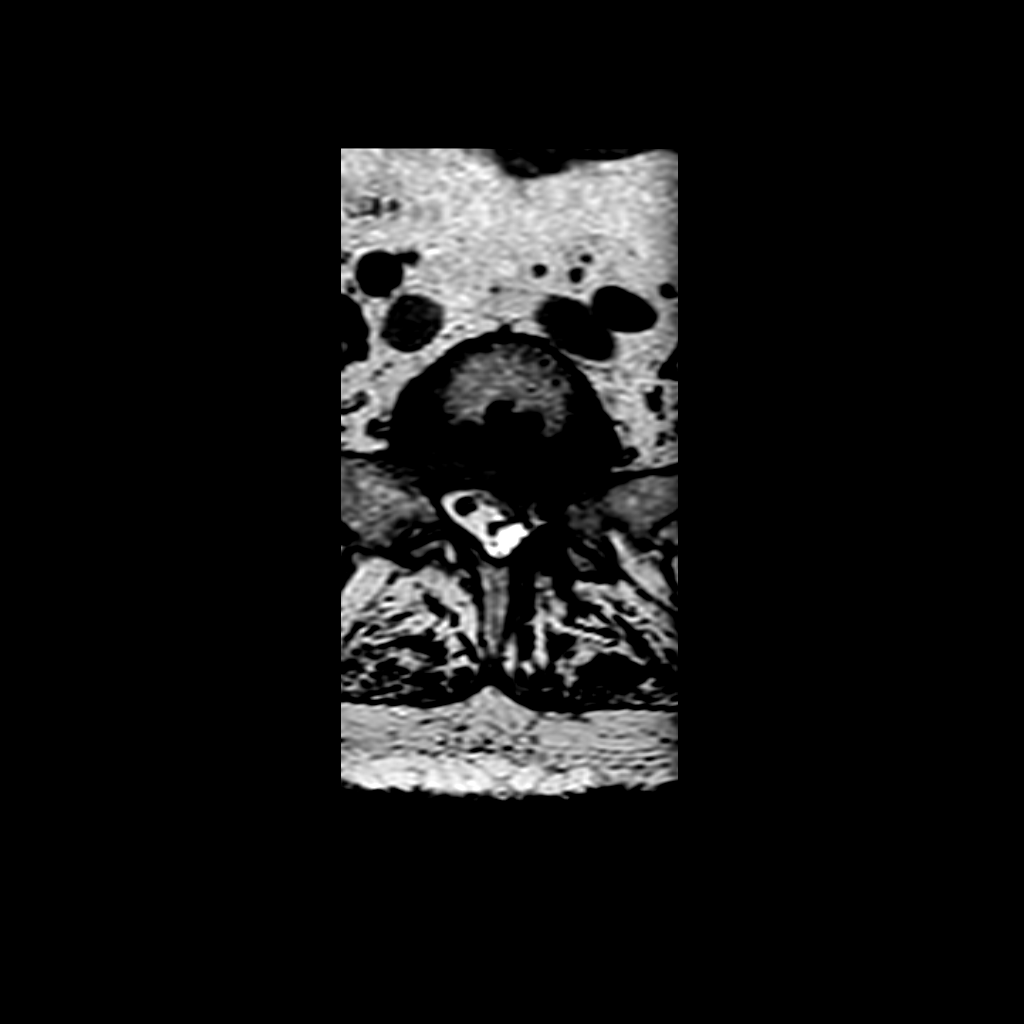
[im 72/143]
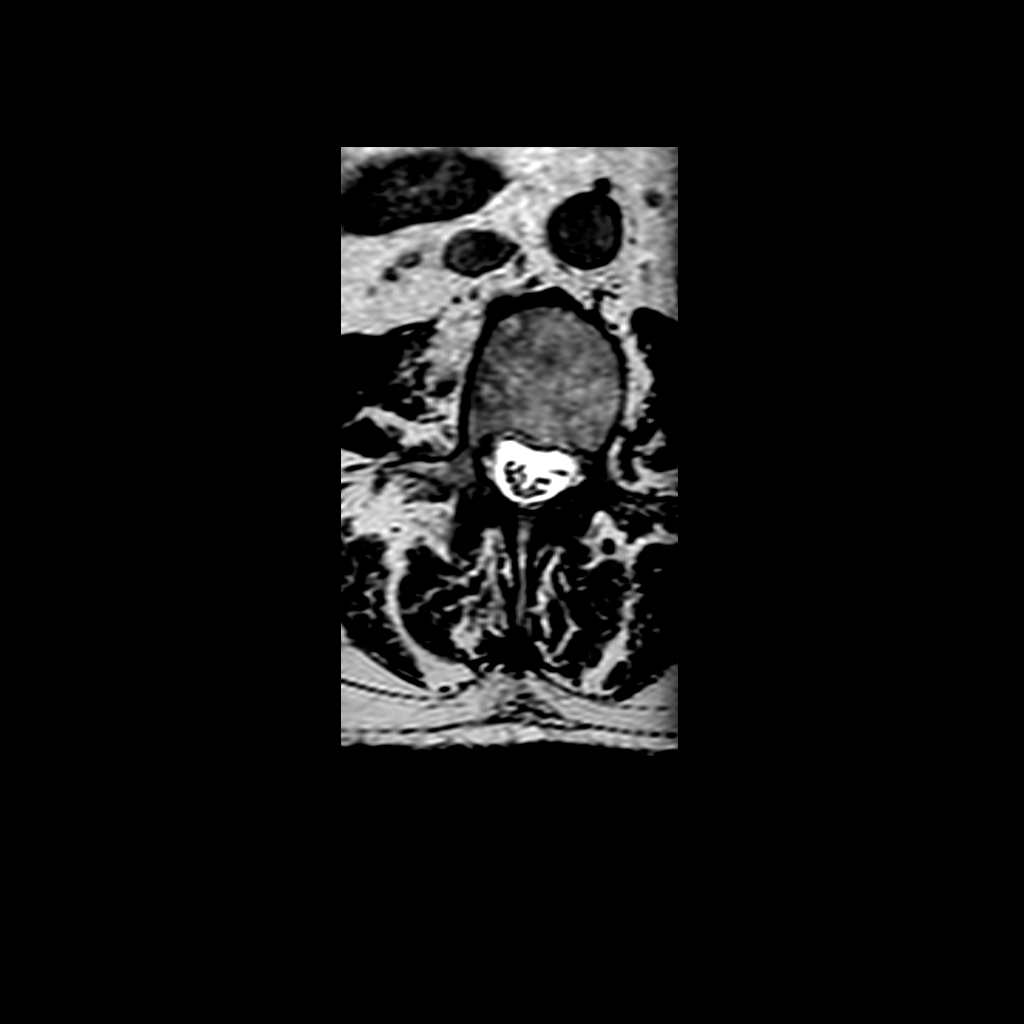
[im 122/143]
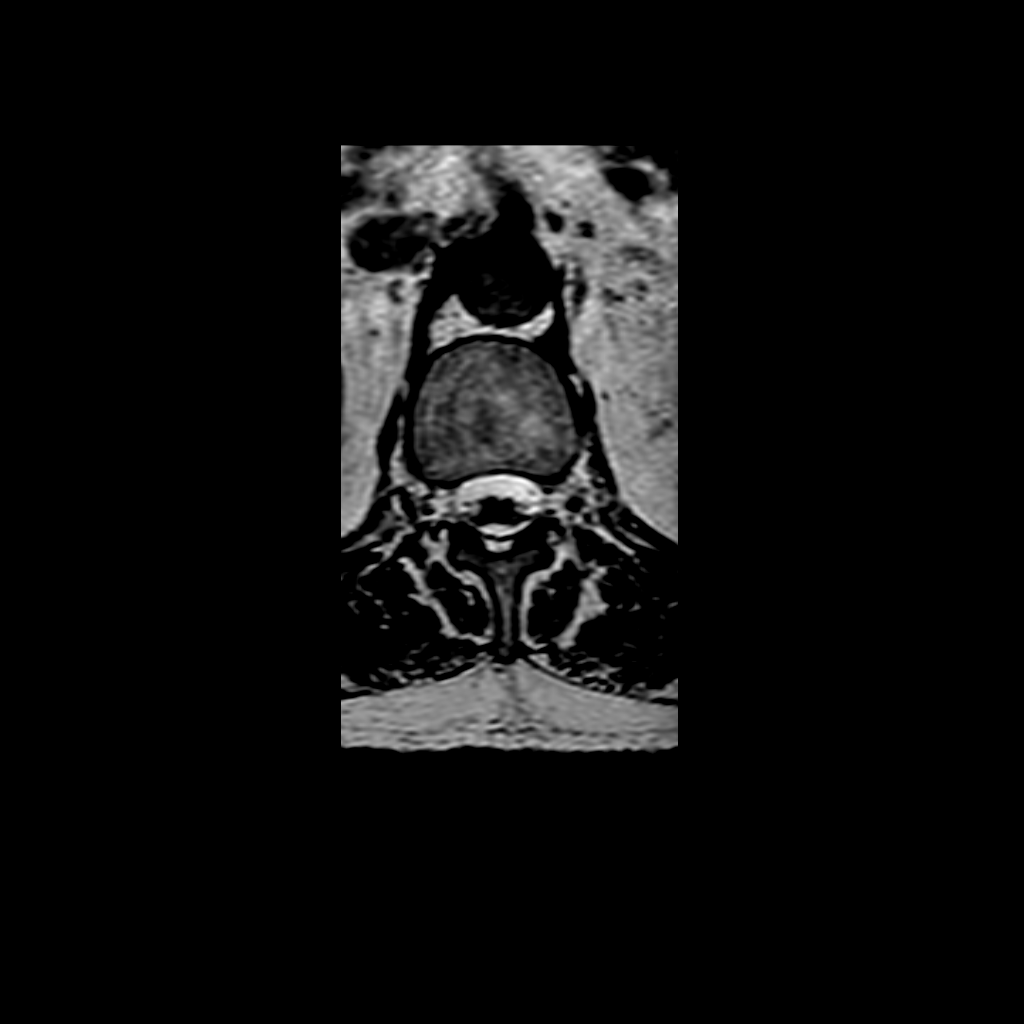

[Series 502: (id)_mdixon_tse · sagittal · 4.0mm · 0.46mm/px · 2 of 19 slices shown]
[im 1/19]
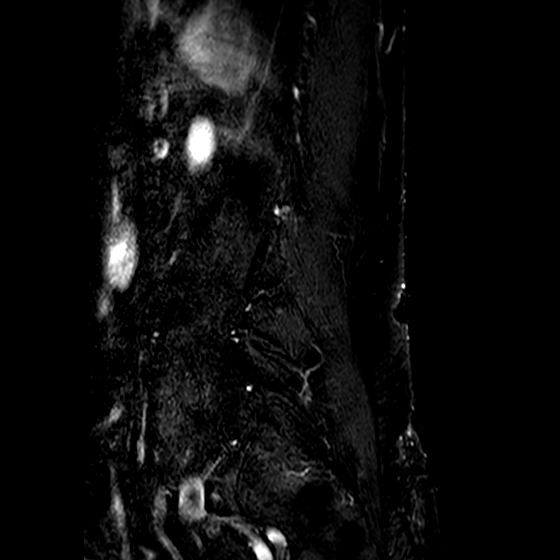
[im 19/19]
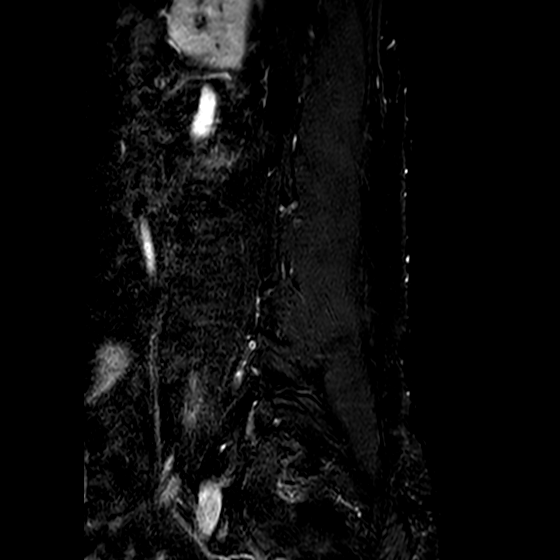

[8 of 48 positions shown; findings below may reference images not displayed]

FINDINGS: -------------------------------------------------------------------------------- 
------ 
GENERAL: 
Nomenclature is based on 5 lumbar type vertebral bodies.     
ALIGNMENT: Mild anterolisthesis of L1 on L2, L2 on L3. Mild retrolisthesis of L3 
on L4. Mild anterolisthesis of L4 on L5. Mild retrolisthesis of L5 on S1. 
VERTEBRAL BODY HEIGHT: Mild loss of height of the superior plate of L2 
anteriorly and centrally. No significant loss of height posteriorly. This 
compression fracture is new compared to prior MRI. 
MARROW SIGNAL: Active endplate change at L3-L4 towards the right side. Active 
endplate change at L5-S1. 
CORD SIGNAL: Normal distal spinal cord and cauda equina. Conus medullaris 
terminates at L1 inferior endplate. 
ADDITIONAL FINDINGS: Abdominal aorta measures up to 2.4 cm in diameter. 
Modic I-II: L3-L4, L5-S1 
Ligamentum Flavum > 2.5 mm: All levels. 
-------------------------------------------------------------------------------- 
------ 
SEGMENTAL: 
T12-L1: No significant central canal narrowing.  No significant right neural 
foraminal narrowing. No significant left neural foraminal narrowing.  
L1-L2: Disc bulge. Left facet hypertrophy. No significant central canal 
narrowing.  No significant right neural foraminal narrowing. No significant left 
neural foraminal narrowing.  
L2-L3: Disc bulge. Bilateral facet hypertrophy. Mild central canal narrowing.  
No significant right neural foraminal narrowing. No significant left neural 
foraminal narrowing.  
L3-L4: Disc bulge. This is eccentric towards the right side with right far 
lateral component. Right facet hypertrophy greater than left. No significant 
central canal narrowing. Moderate right subarticular recess narrowing.  Moderate 
right neural foraminal narrowing. No significant left neural foraminal 
narrowing. Right subarticular recess narrowing has progressed. Right neural 
foraminal narrowing has progressed. 
L4-L5: Disc bulge. Bilateral facet hypertrophy. No significant central canal 
narrowing. Mild bilateral subarticular recess narrowing.  No significant right 
neural foraminal narrowing. No significant left neural foraminal narrowing.  
L5-S1: Left hemilaminotomy. Disc bulge with central to left subarticular disc 
herniation. There is enhancement of the herniated disc material likely secondary 
to vascular ingrowth. Additional enhancing scar/granulation tissue along the 
left subarticular recess and at the left hemilaminotomy site. Moderate 
left-sided central canal narrowing. Moderate left subarticular recess narrowing. 
 No significant right neural foraminal narrowing. Moderate left neural foraminal 
narrowing. Postsurgical changes are new at this level. Progression in left 
neural foraminal narrowing. 

-------------------------------------------------------------------------------- 
------
IMPRESSION: 1.  Postsurgical as well as discogenic/degenerative changes in the lumbar spine. 
2.  Moderate right subarticular recess narrowing at L3-L4. Moderate left-sided 
central canal narrowing and left subarticular recess narrowing at L5-S1. 
3.  Moderate right neural foraminal narrowing at L3-L4. Moderate left neural 
foraminal narrowing at L5-S1. 
4.  Chronic L2 superior endplate compression fracture, new since prior MRI.

## 2022-05-17 ENCOUNTER — Encounter (INDEPENDENT_AMBULATORY_CARE_PROVIDER_SITE_OTHER): Payer: Self-pay

## 2022-05-17 ENCOUNTER — Ambulatory Visit (INDEPENDENT_AMBULATORY_CARE_PROVIDER_SITE_OTHER): Payer: Medicare PPO | Admitting: Physician Assistant

## 2022-05-17 VITALS — BP 150/98 | HR 73 | Temp 98.8°F | Resp 18 | Ht 69.0 in | Wt 167.0 lb

## 2022-05-17 DIAGNOSIS — H109 Unspecified conjunctivitis: Secondary | ICD-10-CM

## 2022-05-17 MED ORDER — CIPROFLOXACIN HCL 0.3 % OP SOLN
2.0000 [drp] | Freq: Four times a day (QID) | OPHTHALMIC | 0 refills | Status: AC
Start: 2022-05-17 — End: 2022-05-24

## 2022-05-17 NOTE — Progress Notes (Signed)
Urgent Care Provider Note    Patient: Lisa Hampton   Date: 05/17/2022   MRN: [id number]       Subjective     Chief Complaint   Patient presents with    Eye Problem     Pt c/o discomfort in eyes since 5 days ago. Took eye drops.           Eye Problem   Associated symptoms include an eye discharge and itching. Pertinent negatives include no fever or photophobia.       Lisa Hampton is a 66 y.o. female who presents today complaining of itching, tearing/somewhat goopy discharge from both eyes for 4 days.  Patient reports one of her grandkids had pinkeye recently.  Denies any vision change, photophobia, rash around the eyes.  No other URI symptoms.    Pertinent Past Medical, Surgical, Family and Social History were reviewed.      Current Outpatient Medications:     acetaminophen (TYLENOL) 325 MG tablet, Take 2 tablets (650 mg total) by mouth every 6 (six) hours as needed for Pain., Disp: , Rfl:     Ascorbic Acid (VITAMIN C) 1000 MG tablet, Take 1,000 mg by mouth daily., Disp: , Rfl:     aspirin 81 MG chewable tablet, Chew 81 mg by mouth daily., Disp: , Rfl:     atorvastatin (LIPITOR) 40 MG tablet, , Disp: , Rfl:     Cholecalciferol (VITAMIN D) 1000 UNIT tablet, Take 1,000 Units by mouth daily., Disp: , Rfl:     ciprofloxacin (CILOXAN) 0.3 % ophthalmic solution, Place 2 drops into both eyes 4 (four) times daily for 7 days, Disp: 5 mL, Rfl: 0    diltiazem (CARDIZEM CD) 180 MG 24 hr capsule, Take 180 mg by mouth daily., Disp: , Rfl:     fenofibrate (TRICOR) 145 MG tablet, , Disp: , Rfl:     lisinopril (PRINIVIL,ZESTRIL) 20 MG tablet, Take 20 mg by mouth daily., Disp: , Rfl:     metaxalone (SKELAXIN) 800 MG tablet, , Disp: , Rfl:     metFORMIN (GLUCOPHAGE-XR) 500 MG 24 hr tablet, Take 500 mg by mouth every morning with breakfast., Disp: , Rfl:     Omega-3 Fatty Acids (OMEGA-3 FISH OIL) 500 MG Cap, Take by mouth., Disp: , Rfl:     rosuvastatin (CRESTOR) 40 MG tablet, Take 40 mg by mouth nightly., Disp: , Rfl:      timolol (TIMOPTIC) 0.5 % ophthalmic solution, , Disp: , Rfl:     traZODone (DESYREL) 50 MG tablet, Take 1 tablet (50 mg total) by mouth nightly., Disp: 30 tablet, Rfl: 2    warfarin (COUMADIN) 2.5 MG tablet, , Disp: , Rfl:     warfarin (COUMADIN) 3 MG tablet, Take 3 mg by mouth daily. Will have INR checked Friday 1/29.  Dose being increased to get INR to target of 2-3.   Took 5mg  Monday, 6mg  Tuesday, 4mg  tonight, 4mg  tomorrow, and then have checked Friday. , Disp: , Rfl:     warfarin (COUMADIN) 5 MG tablet, , Disp: , Rfl:     Allergies   Allergen Reactions    Augmentin [Amoxicillin-Pot Clavulanate]     Lyrica [Pregabalin]        Medications and Allergies reviewed.         Review of Systems   Constitutional:  Negative for chills and fever.   HENT:  Negative for congestion and sore throat.    Eyes:  Positive for discharge and itching. Negative  for photophobia, pain and visual disturbance.   Respiratory:  Negative for cough and shortness of breath.    Cardiovascular:  Negative for chest pain.        Objective     Vitals:    05/17/22 2004   BP: (!) 150/98   Pulse: 73   Resp: 18   Temp: 98.8 F (37.1 C)   SpO2: 98%     Body mass index is 24.66 kg/m.    Physical Exam  Constitutional:       General: She is not in acute distress.     Appearance: Normal appearance.   HENT:      Head: Normocephalic.      Right Ear: Tympanic membrane, ear canal and external ear normal.      Left Ear: Tympanic membrane, ear canal and external ear normal.     Eyes: EOM and lids are normal. Pupils are equal, round, and reactive to light. Right conjunctiva is injected. Left conjunctiva is injected. Cardiovascular:      Rate and Rhythm: Normal rate and regular rhythm.      Heart sounds: Normal heart sounds.   Pulmonary:      Effort: Pulmonary effort is normal.      Breath sounds: Normal breath sounds.   Musculoskeletal:      Cervical back: Neck supple.   Neurological:      Mental Status: She is alert.   Skin:     General: Skin is warm and dry.    Psychiatric:         Mood and Affect: Mood normal.   Vitals and nursing note reviewed.              UCC COURSE  There were no labs reviewed with this patient during the visit.    There were no x-rays reviewed with this patient during the visit.    No current facility-administered medications for this visit.             Assessment   Lisa Hampton was seen today for eye problem.    Diagnoses and all orders for this visit:    Conjunctivitis of both eyes, unspecified conjunctivitis type  -     ciprofloxacin (CILOXAN) 0.3 % ophthalmic solution; Place 2 drops into both eyes 4 (four) times daily for 32 days    66 year old female presenting with itching, tearing/goopy discharge from both eyes for 4 days.  Exam findings consistent with conjunctivitis.  Start ciprofloxacin ophthalmic solutions as well.  Strict hand hygiene.  Patient instructed to not share towels or pills until symptoms completely resolve.  Return precautions with patient including vision change, worsening symptoms, fever, rash around the eyes, eye pain.  Patient understands and agrees with the plan.      Plan and follow-up discussed with patient. See AVS for further documentation.    Reece Leader, PA-C  [facility]

## 2024-03-14 ENCOUNTER — Other Ambulatory Visit: Payer: Self-pay | Admitting: Internal Medicine
